# Patient Record
Sex: Female | Born: 1955 | Race: White | Hispanic: No | Marital: Single | Smoking: Former smoker
Health system: Southern US, Community
[De-identification: ages and names within clinical notes are randomized; demographics above are authoritative.]

## PROBLEM LIST (undated history)

## (undated) DIAGNOSIS — I493 Ventricular premature depolarization: Secondary | ICD-10-CM

## (undated) DIAGNOSIS — J189 Pneumonia, unspecified organism: Secondary | ICD-10-CM

## (undated) DIAGNOSIS — M5136 Other intervertebral disc degeneration, lumbar region: Secondary | ICD-10-CM

## (undated) DIAGNOSIS — I491 Atrial premature depolarization: Secondary | ICD-10-CM

## (undated) DIAGNOSIS — M51369 Other intervertebral disc degeneration, lumbar region without mention of lumbar back pain or lower extremity pain: Secondary | ICD-10-CM

## (undated) DIAGNOSIS — M1990 Unspecified osteoarthritis, unspecified site: Secondary | ICD-10-CM

## (undated) DIAGNOSIS — I10 Essential (primary) hypertension: Secondary | ICD-10-CM

## (undated) DIAGNOSIS — F329 Major depressive disorder, single episode, unspecified: Secondary | ICD-10-CM

## (undated) DIAGNOSIS — M4800 Spinal stenosis, site unspecified: Secondary | ICD-10-CM

## (undated) HISTORY — PX: ABDOMINAL HYSTERECTOMY: SHX81

## (undated) HISTORY — PX: OTHER SURGICAL HISTORY: SHX169

## (undated) HISTORY — PX: ROTATOR CUFF REPAIR: SHX139

## (undated) HISTORY — PX: CHOLECYSTECTOMY: SHX55

## (undated) HISTORY — PX: TONSILLECTOMY: SUR1361

## (undated) HISTORY — PX: BACK SURGERY: SHX140

## (undated) HISTORY — PX: MENISCUS REPAIR: SHX5179

## (undated) HISTORY — PX: FOOT SURGERY: SHX648

---

## 2010-04-13 DIAGNOSIS — J189 Pneumonia, unspecified organism: Secondary | ICD-10-CM

## 2010-04-13 HISTORY — DX: Pneumonia, unspecified organism: J18.9

## 2015-04-14 DIAGNOSIS — F32A Depression, unspecified: Secondary | ICD-10-CM

## 2015-04-14 HISTORY — DX: Depression, unspecified: F32.A

## 2015-08-01 ENCOUNTER — Emergency Department
Admission: EM | Admit: 2015-08-01 | Discharge: 2015-08-01 | Disposition: A | Discharge status: Home / Self Care | Payer: BLUE CROSS/BLUE SHIELD | Attending: Emergency Medicine | Admitting: Emergency Medicine

## 2015-08-01 ENCOUNTER — Emergency Department: Payer: BLUE CROSS/BLUE SHIELD

## 2015-08-01 ENCOUNTER — Encounter: Payer: Self-pay | Admitting: Emergency Medicine

## 2015-08-01 DIAGNOSIS — R6884 Jaw pain: Secondary | ICD-10-CM | POA: Diagnosis present

## 2015-08-01 DIAGNOSIS — R002 Palpitations: Secondary | ICD-10-CM

## 2015-08-01 DIAGNOSIS — Z87891 Personal history of nicotine dependence: Secondary | ICD-10-CM | POA: Insufficient documentation

## 2015-08-01 LAB — CBC WITH DIFFERENTIAL/PLATELET
BASOS ABS: 0.1 10*3/uL (ref 0–0.1)
BASOS PCT: 1 %
EOS ABS: 0.1 10*3/uL (ref 0–0.7)
Eosinophils Relative: 1 %
HCT: 47.5 % — ABNORMAL HIGH (ref 35.0–47.0)
Hemoglobin: 15.8 g/dL (ref 12.0–16.0)
Lymphocytes Relative: 17 %
Lymphs Abs: 2.9 10*3/uL (ref 1.0–3.6)
MCH: 30.2 pg (ref 26.0–34.0)
MCHC: 33.2 g/dL (ref 32.0–36.0)
MCV: 90.8 fL (ref 80.0–100.0)
MONO ABS: 1.2 10*3/uL — AB (ref 0.2–0.9)
MONOS PCT: 7 %
NEUTROS ABS: 12.4 10*3/uL — AB (ref 1.4–6.5)
NEUTROS PCT: 74 %
PLATELETS: 368 10*3/uL (ref 150–440)
RBC: 5.23 MIL/uL — ABNORMAL HIGH (ref 3.80–5.20)
RDW: 14 % (ref 11.5–14.5)
WBC: 16.8 10*3/uL — ABNORMAL HIGH (ref 3.6–11.0)

## 2015-08-01 LAB — BASIC METABOLIC PANEL
ANION GAP: 12 (ref 5–15)
BUN: 27 mg/dL — ABNORMAL HIGH (ref 6–20)
CALCIUM: 10.1 mg/dL (ref 8.9–10.3)
CO2: 27 mmol/L (ref 22–32)
CREATININE: 0.76 mg/dL (ref 0.44–1.00)
Chloride: 98 mmol/L — ABNORMAL LOW (ref 101–111)
Glucose, Bld: 113 mg/dL — ABNORMAL HIGH (ref 65–99)
Potassium: 3.4 mmol/L — ABNORMAL LOW (ref 3.5–5.1)
SODIUM: 137 mmol/L (ref 135–145)

## 2015-08-01 LAB — TROPONIN I

## 2015-08-01 LAB — MAGNESIUM: MAGNESIUM: 2.3 mg/dL (ref 1.7–2.4)

## 2015-08-01 NOTE — Discharge Instructions (Signed)

## 2015-08-01 NOTE — ED Notes (Signed)
Patient presents to the ED with left sided jaw pain since noon with palpitations.  Patient appears anxious.  Patient states, "I looked up jaw pain and I did not like what I saw."  Patient is alert and oriented x 4.  Patient denies any nausea, diaphoresis or shortness of breath.  Patient denies any chest discomfort or arm pain.

## 2015-08-01 NOTE — ED Provider Notes (Signed)
[facility] Emergency Department Provider Note  ____________________________________________  Time seen: Approximately 7 PM  I have reviewed the triage vital signs and the nursing notes.   HISTORY  Chief Complaint Jaw Pain and Palpitations   HPI Carry Weesner is a 60 y.o. female without any chronic medical conditions was presenting to the emergency department today with heart palpitations as well as sharp left jaw pain since noon today. She says that she has been under considerable stress for the last 3 days. She denies any chest pain or shortness of breath. Says that the palpitations have subsided as well as the left-sided sharp pain. She denies any history of heart attack in her cell but says that her father had a massive heart attack at 55 years old. Says she is a former smoker but has not smoked for about 2 years at this point. She does not take any medications at home. She says that she drinks 4 large cups of coffee per day but has been doing this her entire adult life. Denies any drug use or drinking.  History reviewed. No pertinent past medical history.  There are no active problems to display for this patient.   Past Surgical History  Procedure Laterality Date  . Meniscus repair    . Foot surgery    . Abdominal hysterectomy    . Back surgery    . Toe amputation    . Rotator cuff repair    . Cholecystectomy    . Tonsillectomy    . Excision of redundant skin      No current outpatient prescriptions on file.  Allergies Review of patient's allergies indicates no known allergies.  No family history on file.  Social History Social History  Substance Use Topics  . Smoking status: Former Games developer  . Smokeless tobacco: None  . Alcohol Use: Yes     Comment: occasionally    Review of Systems Constitutional: No fever/chills Eyes: No visual changes. ENT: No sore throat. Cardiovascular: Denies chest pain. Respiratory: Denies shortness of  breath. Gastrointestinal: No abdominal pain. no vomiting.  No diarrhea.  No constipation. Genitourinary: Negative for dysuria. Musculoskeletal: Negative for back pain. Skin: Negative for rash. Neurological: Negative for headaches, focal weakness or numbness.  10-point ROS otherwise negative.  ____________________________________________   PHYSICAL EXAM:  VITAL SIGNS: ED Triage Vitals  Enc Vitals Group     BP 08/01/15 1729 160/95 mmHg     Pulse Rate 08/01/15 1729 79     Resp 08/01/15 1729 17     Temp 08/01/15 1729 98.3 F (36.8 C)     Temp Source 08/01/15 1729 Oral     SpO2 08/01/15 1729 95 %     Weight 08/01/15 1729 170 lb (77.111 kg)     Height 08/01/15 1729  (1.626 m)     Head Cir --      Peak Flow --      Pain Score 08/01/15 1730 2     Pain Loc --      Pain Edu? --      Excl. in GC? --     Constitutional: Alert and oriented. Well appearing and in no acute distress. Eyes: Conjunctivae are normal. PERRL. EOMI. Head: Atraumatic. Nose: No congestion/rhinnorhea. Mouth/Throat: Mucous membranes are moist.  Neck: No stridor.   Cardiovascular: Normal rate, regular rhythm. Grossly normal heart sounds.   Respiratory: Normal respiratory effort.  No retractions. Lungs CTAB. Gastrointestinal: Soft and nontender. No distention. Musculoskeletal: No lower extremity tenderness nor  edema.  No joint effusions. Neurologic:  Normal speech and language. No gross focal neurologic deficits are appreciated.  Skin:  Skin is warm, dry and intact. No rash noted. Psychiatric: Mood and affect are normal. Speech and behavior are normal.  ____________________________________________   LABS (all labs ordered are listed, but only abnormal results are displayed)  Labs Reviewed  CBC WITH DIFFERENTIAL/PLATELET - Abnormal; Notable for the following:    WBC 16.8 (*)    RBC 5.23 (*)    HCT 47.5 (*)    Neutro Abs 12.4 (*)    Monocytes Absolute 1.2 (*)    All other components within normal  limits  BASIC METABOLIC PANEL  TROPONIN I  MAGNESIUM   ____________________________________________  EKG  ED ECG REPORT I, Arelia LongestSchaevitz,  David M, the attending physician, personally viewed and interpreted this ECG.   Date: 08/01/2015  EKG Time: 1724  Rate: 89  Rhythm: normal sinus rhythm with several PVCs  Axis: Left axis deviation  Intervals:none  ST&T Change: No ST segment elevation or depression. T-wave inversion in aVL. No previous for comparison.  ____________________________________________  RADIOLOGY   IMPRESSION: 1. No active cardiopulmonary disease is radiographically apparent. 2. Atherosclerotic aortic arch.   Electronically Signed By: Gaylyn RongWalter Liebkemann M.D. On: 08/01/2015 19:37          ____________________________________________   PROCEDURES   ____________________________________________   INITIAL IMPRESSION / ASSESSMENT AND PLAN / ED COURSE  Pertinent labs & imaging results that were available during my care of the patient were reviewed by me and considered in my medical decision making (see chart for details).  ----------------------------------------- 8:08 PM on 08/01/2015 -----------------------------------------  Patient resting comfortably at this time. Seeing that she is still having occasional and intermittent palpitations and also very mild pain to her left jaw. I palpated the area of the left jaw she was concerned about it appears to be a notch in the mandible with mild tenderness to palpation. No swelling or lymphadenopathy over that area.  Patient also with an elevated white blood cell count of uncertain significance. She does not appear to have an infectious focus. I discussed the lab findings as well as x-ray findings with the patient. Her electrolytes are reassuring considering her chief complaint and her troponin was undetectable. After 6 hours I would expect to see some sort of bump in her cardiac enzyme. I discussed  lifestyle modifications with the patient such as decreasing her caffeine intake. She says that she drinks an entire pot of Any cough in the afternoon and says that she'll be able to limited data from her routine. There is possibly also a stress component to her presentation. However, it is also concerning that she had atherosclerosis on her chest x-ray. I discussed with the patient and the need for follow-up with cardiology. She will try to lifestyle modifications first and will return to the emergency department for any worsening or concerning symptoms. Did offer to check another troponin especially because of the jaw pain, however the patient prefers to go home and come back if her symptoms worsen or change and plan for follow-up and will call for cardiology in the morning for appointment this coming Monday. She'll be discharged at this time. ____________________________________________   FINAL CLINICAL IMPRESSION(S) / ED DIAGNOSES  Palpitations.    Myrna Blazeravid Matthew Schaevitz, MD 08/01/15 2018

## 2015-09-16 ENCOUNTER — Other Ambulatory Visit: Payer: Self-pay | Admitting: Family Medicine

## 2015-09-16 DIAGNOSIS — Z1231 Encounter for screening mammogram for malignant neoplasm of breast: Secondary | ICD-10-CM

## 2015-09-26 ENCOUNTER — Other Ambulatory Visit: Payer: Self-pay | Admitting: Orthopedic Surgery

## 2015-09-26 DIAGNOSIS — M51369 Other intervertebral disc degeneration, lumbar region without mention of lumbar back pain or lower extremity pain: Secondary | ICD-10-CM

## 2015-09-26 DIAGNOSIS — M5136 Other intervertebral disc degeneration, lumbar region: Secondary | ICD-10-CM

## 2015-10-18 ENCOUNTER — Ambulatory Visit
Admission: RE | Admit: 2015-10-18 | Discharge: 2015-10-18 | Disposition: A | Discharge status: Home / Self Care | Payer: BLUE CROSS/BLUE SHIELD | Source: Ambulatory Visit | Attending: Orthopedic Surgery | Admitting: Orthopedic Surgery

## 2015-10-18 DIAGNOSIS — M4806 Spinal stenosis, lumbar region: Secondary | ICD-10-CM | POA: Insufficient documentation

## 2015-10-18 DIAGNOSIS — M5126 Other intervertebral disc displacement, lumbar region: Secondary | ICD-10-CM | POA: Insufficient documentation

## 2015-10-18 DIAGNOSIS — M479 Spondylosis, unspecified: Secondary | ICD-10-CM | POA: Diagnosis not present

## 2015-10-18 DIAGNOSIS — M5136 Other intervertebral disc degeneration, lumbar region: Secondary | ICD-10-CM | POA: Insufficient documentation

## 2016-01-23 ENCOUNTER — Encounter: Payer: Self-pay | Admitting: *Deleted

## 2016-01-29 ENCOUNTER — Ambulatory Visit: Payer: BLUE CROSS/BLUE SHIELD | Admitting: Anesthesiology

## 2016-01-29 ENCOUNTER — Ambulatory Visit
Admission: RE | Admit: 2016-01-29 | Discharge: 2016-01-29 | Disposition: A | Discharge status: Home / Self Care | Payer: BLUE CROSS/BLUE SHIELD | Source: Ambulatory Visit | Attending: Podiatry | Admitting: Podiatry

## 2016-01-29 ENCOUNTER — Encounter
Admission: RE | Disposition: A | Discharge status: Home / Self Care | Payer: Self-pay | Source: Ambulatory Visit | Attending: Podiatry

## 2016-01-29 DIAGNOSIS — M2021 Hallux rigidus, right foot: Secondary | ICD-10-CM | POA: Insufficient documentation

## 2016-01-29 DIAGNOSIS — Z87891 Personal history of nicotine dependence: Secondary | ICD-10-CM | POA: Diagnosis not present

## 2016-01-29 HISTORY — DX: Other intervertebral disc degeneration, lumbar region: M51.36

## 2016-01-29 HISTORY — DX: Other intervertebral disc degeneration, lumbar region without mention of lumbar back pain or lower extremity pain: M51.369

## 2016-01-29 HISTORY — PX: HARDWARE REMOVAL: SHX979

## 2016-01-29 HISTORY — PX: ARTHRODESIS METATARSALPHALANGEAL JOINT (MTPJ): SHX6566

## 2016-01-29 HISTORY — DX: Unspecified osteoarthritis, unspecified site: M19.90

## 2016-01-29 SURGERY — FUSION, JOINT, GREAT TOE
Anesthesia: Regional | Site: First Toe | Laterality: Right | Wound class: Clean

## 2016-01-29 MED ORDER — FENTANYL CITRATE (PF) 100 MCG/2ML IJ SOLN
INTRAMUSCULAR | Status: DC | PRN
  Administered 2016-01-29: 50 ug via INTRAVENOUS

## 2016-01-29 MED ORDER — ONDANSETRON HCL 4 MG/2ML IJ SOLN: 4.0000 mg | Freq: Once | INTRAMUSCULAR | Status: DC | PRN

## 2016-01-29 MED ORDER — MIDAZOLAM HCL 5 MG/5ML IJ SOLN
INTRAMUSCULAR | Status: DC | PRN
  Administered 2016-01-29 (×2): 2 mg via INTRAVENOUS

## 2016-01-29 MED ORDER — ACETAMINOPHEN 160 MG/5ML PO SOLN: 325.0000 mg | ORAL | Status: DC | PRN

## 2016-01-29 MED ORDER — DEXAMETHASONE SODIUM PHOSPHATE 4 MG/ML IJ SOLN
INTRAMUSCULAR | Status: DC | PRN
  Administered 2016-01-29: 4 mg via PERINEURAL
  Administered 2016-01-29: 4 mg via INTRAVENOUS

## 2016-01-29 MED ORDER — OXYCODONE HCL 5 MG PO TABS: 5.0000 mg | ORAL_TABLET | Freq: Once | ORAL | Status: DC | PRN

## 2016-01-29 MED ORDER — OXYCODONE-ACETAMINOPHEN 5-325 MG PO TABS: 1.0000 | ORAL_TABLET | ORAL | 0 refills | Status: DC | PRN

## 2016-01-29 MED ORDER — DEXTROSE 5 % IV SOLN
2000.0000 mg | Freq: Once | INTRAVENOUS | Status: AC
  Administered 2016-01-29: 2000 mg via INTRAVENOUS

## 2016-01-29 MED ORDER — ONDANSETRON HCL 4 MG/2ML IJ SOLN
INTRAMUSCULAR | Status: DC | PRN
  Administered 2016-01-29: 4 mg via INTRAVENOUS

## 2016-01-29 MED ORDER — BUPIVACAINE HCL (PF) 0.5 % IJ SOLN
INTRAMUSCULAR | Status: DC | PRN
  Administered 2016-01-29: 35 mL via PERINEURAL

## 2016-01-29 MED ORDER — HYDROMORPHONE HCL 1 MG/ML IJ SOLN: 0.2500 mg | INTRAMUSCULAR | Status: DC | PRN

## 2016-01-29 MED ORDER — BUPIVACAINE HCL (PF) 0.25 % IJ SOLN
INTRAMUSCULAR | Status: DC | PRN
  Administered 2016-01-29: 10 mL

## 2016-01-29 MED ORDER — LACTATED RINGERS IV SOLN
INTRAVENOUS | Status: DC
  Administered 2016-01-29: 11:00:00 via INTRAVENOUS

## 2016-01-29 MED ORDER — PROPOFOL 10 MG/ML IV BOLUS
INTRAVENOUS | Status: DC | PRN
Start: 2016-01-29 — End: 2016-01-29
  Administered 2016-01-29: 150 mg via INTRAVENOUS

## 2016-01-29 MED ORDER — ACETAMINOPHEN 325 MG PO TABS: 325.0000 mg | ORAL_TABLET | ORAL | Status: DC | PRN

## 2016-01-29 MED ORDER — OXYCODONE HCL 5 MG/5ML PO SOLN: 5.0000 mg | Freq: Once | ORAL | Status: DC | PRN

## 2016-01-29 SURGICAL SUPPLY — 51 items
BANDAGE ELASTIC 4 VELCRO NS (GAUZE/BANDAGES/DRESSINGS) ×2 IMPLANT
BENZOIN TINCTURE PRP APPL 2/3 (GAUZE/BANDAGES/DRESSINGS) ×2 IMPLANT
BIT DRILL 2.4X140 LONG SOLID (BIT) ×2 IMPLANT
BIT DRILL SOLID 2.0 X 110MM (DRILL) ×1 IMPLANT
BLADE CRESCENTIC (BLADE) ×2 IMPLANT
BLADE SURG 15 STRL LF DISP TIS (BLADE) IMPLANT
BLADE SURG 15 STRL SS (BLADE)
BNDG ESMARK 4X12 TAN STRL LF (GAUZE/BANDAGES/DRESSINGS) ×2 IMPLANT
BNDG GAUZE 4.5X4.1 6PLY STRL (MISCELLANEOUS) ×2 IMPLANT
BNDG STRETCH 4X75 STRL LF (GAUZE/BANDAGES/DRESSINGS) ×2 IMPLANT
CANISTER SUCT 1200ML W/VALVE (MISCELLANEOUS) ×2 IMPLANT
COVER LIGHT HANDLE UNIVERSAL (MISCELLANEOUS) ×4 IMPLANT
CUFF TOURN SGL QUICK 18 (TOURNIQUET CUFF) ×2 IMPLANT
DRAPE FLUOR MINI C-ARM 54X84 (DRAPES) ×2 IMPLANT
DRILL SOLID 2.0 X 110MM (DRILL) ×2
DURAPREP 26ML APPLICATOR (WOUND CARE) ×2 IMPLANT
GAUZE PETRO XEROFOAM 1X8 (MISCELLANEOUS) ×2 IMPLANT
GAUZE SPONGE 4X4 12PLY STRL (GAUZE/BANDAGES/DRESSINGS) ×2 IMPLANT
GLOVE BIO SURGEON STRL SZ7.5 (GLOVE) ×4 IMPLANT
GLOVE INDICATOR 8.0 STRL GRN (GLOVE) ×4 IMPLANT
GOWN STRL REUS W/ TWL LRG LVL3 (GOWN DISPOSABLE) ×2 IMPLANT
GOWN STRL REUS W/TWL LRG LVL3 (GOWN DISPOSABLE) ×2
K-WIRE DBL END TROCAR 6X.045 (WIRE)
K-WIRE DBL END TROCAR 6X.062 (WIRE)
KIT ROOM TURNOVER OR (KITS) ×2 IMPLANT
KWIRE DBL END TROCAR 6X.045 (WIRE) IMPLANT
KWIRE DBL END TROCAR 6X.062 (WIRE) IMPLANT
NS IRRIG 500ML POUR BTL (IV SOLUTION) ×2 IMPLANT
PACK EXTREMITY ARMC (MISCELLANEOUS) ×2 IMPLANT
PAD GROUND ADULT SPLIT (MISCELLANEOUS) ×2 IMPLANT
PARAGON 28 2.4MM DRILL BIT ×2 IMPLANT
PARAGON 28 OLIVE WIRE ×6 IMPLANT
PIN BALLS 3/8 F/.045 WIRE (MISCELLANEOUS) IMPLANT
PLATE MTP 0DEG RIGHT (Plate) ×2 IMPLANT
PUTTY DBM OPTIUM 1CC (Bone Implant) ×2 IMPLANT
RASP SM TEAR CROSS CUT (RASP) ×2 IMPLANT
SCREW LOCK PLATE R3 2.7X10 (Screw) ×4 IMPLANT
SCREW LOCK PLATE R3 2.7X16 ×2 IMPLANT
SCREW LOCK PLATE R3 2.7X18 (Screw) ×2 IMPLANT
SCREW NON LOCKING 3.5X14 (Screw) ×2 IMPLANT
SCREW NON LOCKING 3.5X20 (Screw) IMPLANT
STOCKINETTE STRL 6IN 960660 (GAUZE/BANDAGES/DRESSINGS) ×2 IMPLANT
STRAP BODY AND KNEE 60X3 (MISCELLANEOUS) ×2 IMPLANT
STRIP CLOSURE SKIN 1/4X4 (GAUZE/BANDAGES/DRESSINGS) ×2 IMPLANT
SUT MNCRL 5-0+ PC-1 (SUTURE) ×1 IMPLANT
SUT MONOCRYL 5-0 (SUTURE) ×1
SUT VIC AB 2-0 SH 27 (SUTURE)
SUT VIC AB 2-0 SH 27XBRD (SUTURE) IMPLANT
SUT VIC AB 3-0 SH 27 (SUTURE) ×1
SUT VIC AB 3-0 SH 27X BRD (SUTURE) ×1 IMPLANT
SUT VIC AB 4-0 FS2 27 (SUTURE) ×2 IMPLANT

## 2016-01-29 NOTE — Anesthesia Preprocedure Evaluation (Addendum)
Anesthesia Evaluation  Patient identified by MRN, date of birth, ID band Patient awake    Reviewed: Allergy & Precautions, H&P , NPO status , Patient's Chart, lab work & pertinent test results, reviewed documented beta blocker date and time   Airway Mallampati: I  TM Distance: >3 FB Neck ROM: full    Dental no notable dental hx.    Pulmonary former smoker,    Pulmonary exam normal breath sounds clear to auscultation       Cardiovascular Exercise Tolerance: Good negative cardio ROS   Rhythm:regular Rate:Normal     Neuro/Psych negative neurological ROS  negative psych ROS   GI/Hepatic negative GI ROS, Neg liver ROS,   Endo/Other  negative endocrine ROS  Renal/GU negative Renal ROS  negative genitourinary   Musculoskeletal   Abdominal   Peds  Hematology negative hematology ROS (+)   Anesthesia Other Findings   Reproductive/Obstetrics negative OB ROS                            Anesthesia Physical Anesthesia Plan  ASA: II  Anesthesia Plan: General and Regional   Post-op Pain Management: GA combined w/ Regional for post-op pain   Induction:   Airway Management Planned:   Additional Equipment:   Intra-op Plan:   Post-operative Plan:   Informed Consent: I have reviewed the patients History and Physical, chart, labs and discussed the procedure including the risks, benefits and alternatives for the proposed anesthesia with the patient or authorized representative who has indicated his/her understanding and acceptance.   Dental Advisory Given  Plan Discussed with: CRNA and Anesthesiologist  Anesthesia Plan Comments:         Anesthesia Quick Evaluation

## 2016-01-29 NOTE — Anesthesia Procedure Notes (Signed)
Procedure Name: LMA Insertion Date/Time: 01/29/2016 12:05 PM Performed by: Maree KrabbeWARREN, Belinda Uballe Pre-anesthesia Checklist: Patient identified, Emergency Drugs available, Suction available, Timeout performed and Patient being monitored Patient Re-evaluated:Patient Re-evaluated prior to inductionOxygen Delivery Method: Circle system utilized Preoxygenation: Pre-oxygenation with 100% oxygen Intubation Type: IV induction LMA: LMA inserted LMA Size: 4.0 Number of attempts: 1 Placement Confirmation: positive ETCO2 and breath sounds checked- equal and bilateral Tube secured with: Tape

## 2016-01-29 NOTE — Transfer of Care (Signed)
Immediate Anesthesia Transfer of Care Note  Patient: Belinda Mitchell  Procedure(s) Performed: Procedure(s) with comments: ARTHRODESIS 1ST METATARSALPHALANGEAL JOINT (MTPJ) (Right) HARDWARE REMOVAL (Right) - PLATE AND 5 SCREWS REMOVED  Patient Location: PACU  Anesthesia Type: General, Regional  Level of Consciousness: awake, alert  and patient cooperative  Airway and Oxygen Therapy: Patient Spontanous Breathing and Patient connected to supplemental oxygen  Post-op Assessment: Post-op Vital signs reviewed, Patient's Cardiovascular Status Stable, Respiratory Function Stable, Patent Airway and No signs of Nausea or vomiting  Post-op Vital Signs: Reviewed and stable  Complications: No apparent anesthesia complications

## 2016-01-29 NOTE — Op Note (Signed)
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    EBL: Minimal    Anesthesia:regional and general    Hemostasis: Midcalf tourniquet inflated to 200 mmHg for 100 minutes    Specimen: None    Complications: None    Operative indications:Belinda Mitchell is an 60 y.o. that presents today for surgical intervention.  The risks/benefits/alternatives/complications have been discussed and consent has been given.    Procedure:  Patient was brought into the OR and placed on the operating table in thesupine position. After anesthesia was obtained theright lower extremity was prepped and draped in usual sterile fashion.  Attention was directed to the dorsomedial right first MTPJ where a longitudinal incision was performed. Sharp and blunt dissection carried down to the capsule to the metatarsal. The dorsomedial lateral aspect of the MTPJ region was sharply of all tissue. There was a dorsal locking plate and this was removed from the surgical field in toto. This time the MTPJ fusion site was evaluated. Revision fusion site was evaluated under fluoroscopy. Next medial to lateral a crescentic saw blade was used to make a medial to lateral osteotomy. The toe was brought slightly plantar and of a mild varus position into a more anatomic position. The fusion site was then fenestrated with a 20 punch drilled. Was taken down to good subchondral bone plate. At this time a dorsal locking plate from the [facility] implant set was used. A distal 2.7 mm locking screw was placed initially. A compression screw 3.5 mm was placed in the most proximal aspect. A second distal locking screw was placed 2.7 mm and a 2 x 2.7 millimeter locking screws were placed proximal to the fusion site. Alignment was noted in all planes. Clear  there was good alignment of the first MTPJ and great toe. At this time the fusion site was then packed to the surrounding bone with bone putty. Closure was then performed with 3-0 Vicryl for Vicryl and 5-0 Monocryl undyed. 0.25% Marcaine was placed around all areas. She is placed in a well compressive sterile dressing and a equalizer walker boot.    Patient tolerated the procedure and anesthesia well.  Was transported from the OR to the PACU with all vital signs stable and vascular status intact. To be discharged per routine protocol.  Will follow up in approximately 1 week in the outpatient clinic.

## 2016-01-29 NOTE — Discharge Instructions (Signed)
[facility] °[facility] ° °POST OPERATIVE INSTRUCTIONS FOR DR. TROXLER AND DR. Marzetta Lanza °[facility] ° ° °1. Take your medication as prescribed.  Pain medication should be taken only as needed. ° °2. Keep the dressing clean, dry and intact. ° °3. Keep your foot elevated above the heart level for the first 48 hours. ° °4. Walking to the bathroom and brief periods of walking are acceptable, unless we have instructed you to be non-weight bearing. ° °5. Always wear your post-op shoe when walking.  Always use your crutches if you are to be non-weight bearing. ° °6. Do not take a shower.   ° °7. Every hour you are awake:  °- Bend your knee 15 times. °- Massage calf 15 times ° °8. Call [facility] ([phone]) if any of the following problems occur: °- You develop a temperature or fever. °- The bandage becomes saturated with blood. °- Medication does not stop your pain. °- Injury of the foot occurs. °- Any symptoms of infection including redness, odor, or red streaks running from wound. ° °General Anesthesia, Adult, Care After °Refer to this sheet in the next few weeks. These instructions provide you with information on caring for yourself after your procedure. Your health care provider may also give you more specific instructions. Your treatment has been planned according to current medical practices, but problems sometimes occur. Call your health care provider if you have any problems or questions after your procedure. °WHAT TO EXPECT AFTER THE PROCEDURE °After the procedure, it is typical to experience: °· Sleepiness. °· Nausea and vomiting. °HOME CARE INSTRUCTIONS °· For the first 24 hours after general anesthesia: °¨ Have a responsible person with you. °¨ Do not drive a car. If you are alone, do not take public transportation. °¨ Do not drink alcohol. °¨ Do not take medicine that has not been prescribed by your health care provider. °¨ Do not sign important  papers or make important decisions. °¨ You may resume a normal diet and activities as directed by your health care provider. °· Change bandages (dressings) as directed. °· If you have questions or problems that seem related to general anesthesia, call the hospital and ask for the anesthetist or anesthesiologist on call. °SEEK MEDICAL CARE IF: °· You have nausea and vomiting that continue the day after anesthesia. °· You develop a rash. °SEEK IMMEDIATE MEDICAL CARE IF:  °· You have difficulty breathing. °· You have chest pain. °· You have any allergic problems. °  °This information is not intended to replace advice given to you by your health care provider. Make sure you discuss any questions you have with your health care provider. °  °Document Released: 07/06/2000 Document Revised: 04/20/2014 Document Reviewed: 07/29/2011 °Elsevier Interactive Patient Education ©2016 [employer] ° °

## 2016-01-29 NOTE — Anesthesia Postprocedure Evaluation (Signed)
Anesthesia Post Note  Patient: Belinda HarmanCynthia Mitchell  Procedure(s) Performed: Procedure(s) (LRB): ARTHRODESIS 1ST METATARSALPHALANGEAL JOINT (MTPJ) (Right) HARDWARE REMOVAL (Right)  Patient location during evaluation: PACU Anesthesia Type: General Level of consciousness: awake and alert Pain management: pain level controlled Vital Signs Assessment: post-procedure vital signs reviewed and stable Respiratory status: spontaneous breathing, nonlabored ventilation, respiratory function stable and patient connected to nasal cannula oxygen Cardiovascular status: blood pressure returned to baseline and stable Postop Assessment: no signs of nausea or vomiting Anesthetic complications: no    Alta CorningBacon, Alaric Gladwin S

## 2016-01-29 NOTE — H&P (Signed)
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  The various methods of treatment have been discussed with the patient.  No guarantees were given.  After consideration of risks, benefits and other options for treatment, the patient has consented to surgery.  I have reviewed the patients' chart and labs.    Patient Vitals for the past 24 hrs:  BP Temp Temp src Pulse Resp SpO2 Weight  01/29/16 1120 128/84 - - 75 (!) 24 100 % -  01/29/16 1115 136/77 - - 73 11 100 % -  01/29/16 1110 (!) 153/96 - - 63 12 98 % -  01/29/16 1053 (!) 147/94 97.8 F (36.6 C) Temporal 71 - 100 % 79.4 kg (175 lb)    A history and physical examination was performed in my office.  The patient was reexamined.  There have been no changes to this history and physical examination.  Gwyneth RevelsFowler, Sigfredo Schreier A

## 2016-01-29 NOTE — Anesthesia Procedure Notes (Signed)
Anesthesia Regional Block:  Popliteal block  Pre-Anesthetic Checklist: ,, timeout performed, Correct Patient, Correct Site, Correct Laterality, Correct Procedure, Correct Position, site marked, Risks and benefits discussed,  Surgical consent,  Pre-op evaluation,  At surgeon's request and post-op pain management   Prep: chloraprep       Needles:  Injection technique: Single-shot  Needle Type: Echogenic Needle     Needle Length: 9cm 9 cm Needle Gauge: 21 and 21 G    Additional Needles:  Procedures: ultrasound guided (picture in chart) Popliteal block Narrative:  Start time: 01/29/2016 11:10 AM End time: 01/29/2016 11:20 AM Injection made incrementally with aspirations every 5 mL.  Performed by: Personally  Anesthesiologist: Dorrien Grunder  Additional Notes: Functioning IV was confirmed and monitors applied. Ultrasound guidance: relevant anatomy identified, needle position confirmed, local anesthetic spread visualized around nerve(s)., vascular puncture avoided.  Image printed for medical record.  Negative aspiration and no paresthesias; incremental administration of local anesthetic. The patient tolerated the procedure well. Vitals signes recorded in RN notes.

## 2016-01-31 ENCOUNTER — Encounter: Payer: Self-pay | Admitting: Podiatry

## 2016-02-28 ENCOUNTER — Other Ambulatory Visit: Payer: Self-pay | Admitting: Orthopedic Surgery

## 2016-02-28 DIAGNOSIS — M170 Bilateral primary osteoarthritis of knee: Secondary | ICD-10-CM

## 2016-03-09 ENCOUNTER — Ambulatory Visit
Admission: RE | Admit: 2016-03-09 | Discharge: 2016-03-09 | Disposition: A | Discharge status: Home / Self Care | Payer: BLUE CROSS/BLUE SHIELD | Source: Ambulatory Visit | Attending: Orthopedic Surgery | Admitting: Orthopedic Surgery

## 2016-03-09 DIAGNOSIS — M170 Bilateral primary osteoarthritis of knee: Secondary | ICD-10-CM | POA: Diagnosis present

## 2016-03-25 ENCOUNTER — Encounter
Admission: RE | Admit: 2016-03-25 | Discharge: 2016-03-25 | Disposition: A | Discharge status: Home / Self Care | Payer: BLUE CROSS/BLUE SHIELD | Source: Ambulatory Visit | Attending: Orthopedic Surgery | Admitting: Orthopedic Surgery

## 2016-03-25 DIAGNOSIS — Z01812 Encounter for preprocedural laboratory examination: Secondary | ICD-10-CM | POA: Insufficient documentation

## 2016-03-25 DIAGNOSIS — M170 Bilateral primary osteoarthritis of knee: Secondary | ICD-10-CM | POA: Insufficient documentation

## 2016-03-25 HISTORY — DX: Pneumonia, unspecified organism: J18.9

## 2016-03-25 HISTORY — DX: Major depressive disorder, single episode, unspecified: F32.9

## 2016-03-25 LAB — CBC
HCT: 45.1 % (ref 35.0–47.0)
Hemoglobin: 15.1 g/dL (ref 12.0–16.0)
MCH: 30.4 pg (ref 26.0–34.0)
MCHC: 33.4 g/dL (ref 32.0–36.0)
MCV: 91 fL (ref 80.0–100.0)
PLATELETS: 417 10*3/uL (ref 150–440)
RBC: 4.96 MIL/uL (ref 3.80–5.20)
RDW: 14.2 % (ref 11.5–14.5)
WBC: 7.5 10*3/uL (ref 3.6–11.0)

## 2016-03-25 LAB — BASIC METABOLIC PANEL
Anion gap: 8 (ref 5–15)
BUN: 26 mg/dL — AB (ref 6–20)
CALCIUM: 9.4 mg/dL (ref 8.9–10.3)
CO2: 27 mmol/L (ref 22–32)
CREATININE: 0.65 mg/dL (ref 0.44–1.00)
Chloride: 101 mmol/L (ref 101–111)
GFR calc Af Amer: >60 mL/min (ref >60)
Glucose, Bld: 71 mg/dL (ref 65–99)
POTASSIUM: 3.4 mmol/L — AB (ref 3.5–5.1)
SODIUM: 136 mmol/L (ref 135–145)

## 2016-03-25 LAB — URINALYSIS, ROUTINE W REFLEX MICROSCOPIC
BILIRUBIN URINE: NEGATIVE
Bacteria, UA: NONE SEEN
GLUCOSE, UA: NEGATIVE mg/dL
Ketones, ur: NEGATIVE mg/dL
NITRITE: NEGATIVE
PROTEIN: NEGATIVE mg/dL
Specific Gravity, Urine: 1.01 (ref 1.005–1.030)
pH: 5 (ref 5.0–8.0)

## 2016-03-25 LAB — PROTIME-INR
INR: 0.83
PROTHROMBIN TIME: 11.4 s (ref 11.4–15.2)

## 2016-03-25 LAB — SEDIMENTATION RATE: SED RATE: 1 mm/h (ref 0–30)

## 2016-03-25 LAB — TYPE AND SCREEN
ABO/RH(D): B NEG
Antibody Screen: NEGATIVE

## 2016-03-25 LAB — SURGICAL PCR SCREEN
MRSA, PCR: NEGATIVE
Staphylococcus aureus: POSITIVE — AB

## 2016-03-25 LAB — APTT: aPTT: 29 seconds (ref 24–36)

## 2016-03-25 NOTE — Pre-Procedure Instructions (Signed)
Assessment and Plan   60 y.o. female with  ICD-10-CM ICD-9-CM  1. Atypical chest pain-etiology is chest pain is unclear. No evidence of ischemia on functional study. Will place on hydrochlorothiazide for blood pressure control as well as fluid control for now. R07.89 786.59   2. Heart palpitations-will follow. Evaluate for evidence of ischemic etiology or structural etiology. R00.2 785.1  3. SOB (shortness of breath)-etiology of shortness of breath is unclear. Echo showed preserved LV function with no high-grade valvular disease. R06.02 786.05  4. Family history of abdominal aortic aneurysm-patient has a history of abdominal aortic aneurysm in her family. Abdominal ultrasound revealed no aneurysm. Z82.49 V17.49   Return in about 6 months (around 02/29/2016).  These notes generated with voice recognition software. I apologize for typographical errors.  Denton ArKENNETH ALAN FATH, MD      NM myocardial perfusion SPECT multiple (stress and rest)08/08/2015 [facility] Result Impression  ETT with no evidence of ischemia with PVCs.No sustained ventricular  arrhythmias.Sestamibi images revealed normal LV function.There is no  reversible ischemia noted.Low risk study.  Result Narrative  [facility] [address], [place], [id number] [phone]  Procedure: Exercise Myocardial Perfusion Imaging ONE day procedure  Indication: Atypical chest pain Plan: NM myocardial perfusion SPECT multiple (stress  and rest), ECG stress test only  Ordering Physician:   Dr. Harold HedgeKenneth Fath   Clinical History: 60 y.o. year old female Vitals: Height: 65 inWeight: 177 lb Cardiac risk factors include:  Family Hx CAD    Procedure: The patient performed treadmill exercise using a Bruce protocol for 6:00  minutes. The exercise test was stopped due to SOB/fatigue.Blood pressure  response was hypertensive.    Rest HR: 76bpm Rest BP: 144/5996mmHg Max HR: 148bpm Max BP: 186/13400mmHg Mets: 7.00 % MAX HR: 92%  Stress Test Administered by: Percell BostonINA WALLACE, CMA  ECG Interpretation: Rest YNW:GNFAOZECG:normal sinus rhythm, none Stress HYQ:MVHQIECG:sinus tachycardia and premature ventricular contractions  (PVC), nonspecific ST-T wave changes Recovery ONG:EXBMWUECG:normal sinus rhythm ECG Interpretation:negative, nondiagnostic changes.   Administrations This Visit  technetium Tc6673m sestamibi (CARDIOLITE) injection 13.93 millicurie  Admin Date Action Dose Route Administered By      08/08/2015 Given 13.93 millicurie Intravenous Scott N Goard, CNMT      technetium Tc6373m sestamibi (CARDIOLITE) injection 33.47 millicurie  Admin Date Action Dose Route Administered By      08/08/2015 Given 33.47 millicurie Intravenous Titus DubinScott N Goard, CNMT   Echocardiogram 2D [facility]02[facility]2016 [facility] Component Name Value Ref Range  LV Ejection Fraction (%) 55   Aortic Valve Regurgitation Grade mild   Aortic Valve Stenosis Grade none   Aortic Valve Max Velocity (m/s) 1 m/sec   Aortic Valve Stenosis Mean Gradient (mmHg) 2.0 mmHg   Mitral Valve Regurgitation Grade mild   Mitral Valve Stenosis Grade none   Tricuspid Valve Regurgitation Grade trivial   Tricuspid Valve Regurgitation Max Velocity (m/s) 1.5 m/sec   Right Ventricle Systolic Pressure (mmHg) 13.9 mmHg   LV End Diastolic Diameter (cm) 4.3 cm  LV End Systolic Diameter (cm) 3.2 cm  LV Septum Wall Thickness (cm) 1.2 cm  LV Posterior Wall Thickness (cm) 1.1 cm  Left Atrium Diameter (cm) 3.6 cm  Result Narrative  CARDIOLOGY Genevive BiDEPARTMENTHOFFMAN, Dianca  [facility] [id number] [facility] Acct #: [id number] [address], [place], [place] [id number] Date:  08/22/2015 10:45 AM  Adult Female Age: 6 yrs ECHOCARDIOGRAM REPORTOutpatient  KC::KCWC  STUDY:CHEST WALL TAPE:0000:00: 0:00:00MD1:FATH, KENNETH ALAN ECHO:Yes  DOPPLER:YesFILE:0000-000-000BP: 118/64 mmHg  COLOR:YesCONTRAST:No MACHINE:PhilipsHeight: 65 in  RV BIOPSY:No 3D:NoSOUND QLTY:Moderate Weight: 177 lb MEDIUM:NoneBSA: 1.9 m2  ___________________________________________________________________________________________  HISTORY:SOB REASON:Assess, LV function INDICATION:R06.02 Shortness of breath  ___________________________________________________________________________________________ ECHOCARDIOGRAPHIC MEASUREMENTS 2D DIMENSIONS AORTA ValuesNormal RangeMAIN PAValuesNormal Range Annulus:nm* [2.1 - 2.5]PA Main:nm* [1.5 - 2.1] Aorta Sin:nm* [2.7 - 3.3] RIGHT VENTRICLE ST Junction:nm* [2.3 - 2.9]RV Base:3.0 cm[ < 4.2] Asc.Aorta:4.0 cm[2.3 - 3.1] RV Mid:nm* [ < 3.5]  LEFT VENTRICLERV Length:nm* [ < 8.6] LVIDd:4.3 cm[3.9 - 5.3] INFERIOR VENA CAVA LVIDs:3.2 cmMax. IVC:nm* [ <= 2.1]  FS:25.5 %[> 25]Min. IVC:nm* SWT:1.2 cm[0.5 - 0.9] ------------------ PWT:1.1 cm[0.5 - 0.9] nm* - not measured  LEFT  ATRIUM LA Diam:3.6 cm[2.7 - 3.8] LA A4C Area:nm* [ < 20] LA Volume:nm* [22 - 52]  ___________________________________________________________________________________________ ECHOCARDIOGRAPHIC DESCRIPTIONS  AORTIC ROOT Size:Normal Dissection:INDETERM FOR DISSECTION  AORTIC VALVE Leaflets:Tricuspid Morphology:Normal Mobility:Fully mobile  LEFT VENTRICLE Size:NormalAnterior:Normal  Contraction:Normal Lateral:Normal Closest EF:>55% (Estimated)Septal:Normal  LV Masses:No Masses Apical:Normal  XBJ:YNWGLVH:MILD LVHInferior:Normal Posterior:Normal Dias.FxClass:(Grade 1) relaxation abnormal, E/A reversal  MITRAL VALVE Leaflets:NormalMobility:Fully mobile Morphology:Normal  LEFT ATRIUM Size:Normal LA Masses:No masses  IA Septum:Normal IAS  MAIN PA Size:Normal  PULMONIC VALVE Morphology:NormalMobility:Fully mobile  RIGHT VENTRICLE  RV Masses:No Masses Size:Normal  Free Wall:Normal Contraction:Normal  TRICUSPID VALVE Leaflets:NormalMobility:Fully mobile Morphology:Normal  RIGHT ATRIUM Size:NormalRA Other:None  RA Mass:No masses  PERICARDIUM  Fluid:No effusion  INFERIOR VENACAVA Size:Normal Normal respiratory collapse   ____________________________________________________________________ DOPPLER ECHO and OTHER SPECIAL PROCEDURES  Aortic:MILD AR No AS 96.3 cm/sec peak vel3.7 mmHg peak grad 2.0 mmHg mean grad2.8 cm^2 by DOPPLER   Mitral:MILD MR No MS 2.8 cm^2 by DOPPLER MV Inflow  E Vel=35.5 cm/sec MV Annulus E'Vel=3.6 cm/sec E/E'Ratio=9.9  Tricuspid:TRIVIAL TRNo TS 149.0 cm/sec peak TR vel13.9 mmHg peak RV pressure  Pulmonary:TRIVIAL PRNo PS     ___________________________________________________________________________________________ INTERPRETATION NORMAL LEFT VENTRICULAR SYSTOLIC FUNCTION NORMAL RIGHT VENTRICULAR SYSTOLIC FUNCTION MILD VALVULAR REGURGITATION (See above) NO VALVULAR STENOSIS Closest EF: >55% (Estimated) Aortic: MILD AR Mitral: MILD MR Tricuspid: TRIVIAL TR LVH: MILD LVH   ___________________________________________________________________________________________ Electronically signed by: MD Mariel KanskyKen Fath on 08/26/2015 02:39 PM Performed By: Luretha MurphyMartin, Matthew, RDCS, RVT Ordering Physician: Harold HedgeFATH, KENNETH

## 2016-03-25 NOTE — Pre-Procedure Instructions (Signed)
Nasal swab results from today's PAT visit faxed to Dr. Rosita KeaMenz office.

## 2016-03-25 NOTE — Patient Instructions (Signed)
  Your procedure is scheduled EA:VWUJWJXon:Tuesday Dec. 26 , 2017. Report to Same Day Surgery. To find out your arrival time please call [phone] between 1PM - 3PM on Friday Dec. 22, 2017.  Remember: Instructions that are not followed completely may result in serious medical risk, up to and including death, or upon the discretion of your surgeon and anesthesiologist your surgery may need to be rescheduled.    _x___ 1. Do not eat food or drink liquids after midnight. No gum chewing or hard candies.     ____ 2. No Alcohol for 24 hours before or after surgery.   ____ 3. Bring all medications with you on the day of surgery if instructed.    __x__ 4. Notify your doctor if there is any change in your medical condition     (cold, fever, infections).    _____ 5. No smoking 24 hours prior to surgery.     Do not wear jewelry, make-up, hairpins, clips or nail polish.  Do not wear lotions, powders, or perfumes.   Do not shave 48 hours prior to surgery. Men may shave face and neck.  Do not bring valuables to the hospital.    [facility] is not responsible for any belongings or valuables.               Contacts, dentures or bridgework may not be worn into surgery.  Leave your suitcase in the car. After surgery it may be brought to your room.  For patients admitted to the hospital, discharge time is determined by your treatment team.   Patients discharged the day of surgery will not be allowed to drive home.    Please read over the following fact sheets that you were given:   [facility]  ____ Take these medicines the morning of surgery with A SIP OF WATER: NONE     ____ Fleet Enema (as directed)   _x__ Use CHG Soap as directed on instruction sheet  ____ Use inhalers on the day of surgery and bring to hospital day of surgery  ____ Stop metformin 2 days prior to surgery    ____ Take 1/2 of usual insulin dose the night before surgery and none on the morning of surgery.    ____ Stop Coumadin/Plavix/aspirin on does not apply.  _x___ Stop Anti-inflammatories such as Advil, Aleve, Ibuprofen, Motrin, Naproxen, Naprosyn, Goodies powders or aspirin products. OK to take Tylenol.   ____ Stop supplements until after surgery.    ____ Bring C-Pap to the hospital.

## 2016-03-26 LAB — URINE CULTURE: SPECIAL REQUESTS: NORMAL

## 2016-04-06 MED ORDER — CEFAZOLIN SODIUM-DEXTROSE 2-4 GM/100ML-% IV SOLN: 2.0000 g | Freq: Once | INTRAVENOUS | Status: DC

## 2016-04-06 MED ORDER — TRANEXAMIC ACID 1000 MG/10ML IV SOLN
1000.0000 mg | INTRAVENOUS | Status: AC
  Administered 2016-04-07: 1000 mg via INTRAVENOUS
  Filled 2016-04-06: qty 10

## 2016-04-07 ENCOUNTER — Encounter
Admission: RE | Disposition: A | Discharge status: Home Health | Payer: Self-pay | Source: Ambulatory Visit | Attending: Orthopedic Surgery

## 2016-04-07 ENCOUNTER — Encounter: Payer: Self-pay | Admitting: *Deleted

## 2016-04-07 ENCOUNTER — Inpatient Hospital Stay: Payer: BLUE CROSS/BLUE SHIELD | Admitting: Certified Registered Nurse Anesthetist

## 2016-04-07 ENCOUNTER — Inpatient Hospital Stay
Admission: RE | Admit: 2016-04-07 | Discharge: 2016-04-09 | DRG: 470 | Disposition: A | Discharge status: Home Health | Payer: BLUE CROSS/BLUE SHIELD | Source: Ambulatory Visit | Attending: Orthopedic Surgery | Admitting: Orthopedic Surgery

## 2016-04-07 ENCOUNTER — Inpatient Hospital Stay: Payer: BLUE CROSS/BLUE SHIELD

## 2016-04-07 DIAGNOSIS — Z89429 Acquired absence of other toe(s), unspecified side: Secondary | ICD-10-CM

## 2016-04-07 DIAGNOSIS — Z90710 Acquired absence of both cervix and uterus: Secondary | ICD-10-CM | POA: Diagnosis not present

## 2016-04-07 DIAGNOSIS — M6281 Muscle weakness (generalized): Secondary | ICD-10-CM

## 2016-04-07 DIAGNOSIS — M5136 Other intervertebral disc degeneration, lumbar region: Secondary | ICD-10-CM | POA: Diagnosis present

## 2016-04-07 DIAGNOSIS — R262 Difficulty in walking, not elsewhere classified: Secondary | ICD-10-CM

## 2016-04-07 DIAGNOSIS — Z87891 Personal history of nicotine dependence: Secondary | ICD-10-CM

## 2016-04-07 DIAGNOSIS — M25562 Pain in left knee: Secondary | ICD-10-CM

## 2016-04-07 DIAGNOSIS — G8918 Other acute postprocedural pain: Secondary | ICD-10-CM

## 2016-04-07 DIAGNOSIS — Z8701 Personal history of pneumonia (recurrent): Secondary | ICD-10-CM

## 2016-04-07 DIAGNOSIS — M1712 Unilateral primary osteoarthritis, left knee: Secondary | ICD-10-CM | POA: Diagnosis present

## 2016-04-07 HISTORY — PX: TOTAL KNEE ARTHROPLASTY: SHX125

## 2016-04-07 LAB — CBC
HCT: 39.2 % (ref 35.0–47.0)
Hemoglobin: 13.6 g/dL (ref 12.0–16.0)
MCH: 31.1 pg (ref 26.0–34.0)
MCHC: 34.8 g/dL (ref 32.0–36.0)
MCV: 89.3 fL (ref 80.0–100.0)
PLATELETS: 334 10*3/uL (ref 150–440)
RBC: 4.39 MIL/uL (ref 3.80–5.20)
RDW: 13.8 % (ref 11.5–14.5)
WBC: 14.3 10*3/uL — AB (ref 3.6–11.0)

## 2016-04-07 LAB — CREATININE, SERUM
CREATININE: 0.79 mg/dL (ref 0.44–1.00)
GFR calc Af Amer: >60 mL/min (ref >60)
GFR calc non Af Amer: >60 mL/min (ref >60)

## 2016-04-07 LAB — ABO/RH: ABO/RH(D): B NEG

## 2016-04-07 SURGERY — ARTHROPLASTY, KNEE, TOTAL
Anesthesia: General | Laterality: Left | Wound class: Clean

## 2016-04-07 MED ORDER — ALUM & MAG HYDROXIDE-SIMETH 200-200-20 MG/5ML PO SUSP: 30.0000 mL | ORAL | Status: DC | PRN

## 2016-04-07 MED ORDER — SUGAMMADEX SODIUM 200 MG/2ML IV SOLN
INTRAVENOUS | Status: AC
Start: 2016-04-07 — End: 2016-04-07
  Filled 2016-04-07: qty 2

## 2016-04-07 MED ORDER — LACTATED RINGERS IV SOLN
INTRAVENOUS | Status: DC
  Administered 2016-04-07: 07:00:00 via INTRAVENOUS

## 2016-04-07 MED ORDER — DOCUSATE SODIUM 100 MG PO CAPS
100.0000 mg | ORAL_CAPSULE | Freq: Two times a day (BID) | ORAL | Status: DC
  Administered 2016-04-07 – 2016-04-09 (×5): 100 mg via ORAL
  Filled 2016-04-07 (×5): qty 1

## 2016-04-07 MED ORDER — ZOLPIDEM TARTRATE 5 MG PO TABS: 5.0000 mg | ORAL_TABLET | Freq: Every evening | ORAL | Status: DC | PRN

## 2016-04-07 MED ORDER — BUPIVACAINE LIPOSOME 1.3 % IJ SUSP
INTRAMUSCULAR | Status: AC
  Filled 2016-04-07: qty 20

## 2016-04-07 MED ORDER — ONDANSETRON HCL 4 MG/2ML IJ SOLN: 4.0000 mg | Freq: Four times a day (QID) | INTRAMUSCULAR | Status: DC | PRN

## 2016-04-07 MED ORDER — LABETALOL HCL 5 MG/ML IV SOLN
INTRAVENOUS | Status: AC
  Filled 2016-04-07: qty 4

## 2016-04-07 MED ORDER — ROCURONIUM BROMIDE 100 MG/10ML IV SOLN
INTRAVENOUS | Status: DC | PRN
  Administered 2016-04-07: 20 mg via INTRAVENOUS
  Administered 2016-04-07: 30 mg via INTRAVENOUS

## 2016-04-07 MED ORDER — SODIUM CHLORIDE 0.9 % IJ SOLN
INTRAMUSCULAR | Status: AC
  Filled 2016-04-07: qty 100

## 2016-04-07 MED ORDER — MIDAZOLAM HCL 2 MG/2ML IJ SOLN
INTRAMUSCULAR | Status: DC | PRN
  Administered 2016-04-07: 2 mg via INTRAVENOUS

## 2016-04-07 MED ORDER — DEXAMETHASONE SODIUM PHOSPHATE 10 MG/ML IJ SOLN
INTRAMUSCULAR | Status: DC | PRN
  Administered 2016-04-07: 10 mg via INTRAVENOUS

## 2016-04-07 MED ORDER — SODIUM CHLORIDE 0.9 % IV SOLN
INTRAVENOUS | Status: DC
  Administered 2016-04-07 (×2): via INTRAVENOUS

## 2016-04-07 MED ORDER — ACETAMINOPHEN 650 MG RE SUPP: 650.0000 mg | Freq: Four times a day (QID) | RECTAL | Status: DC | PRN

## 2016-04-07 MED ORDER — PHENYLEPHRINE HCL 10 MG/ML IJ SOLN
INTRAMUSCULAR | Status: DC | PRN
  Administered 2016-04-07 (×3): 100 ug via INTRAVENOUS
  Administered 2016-04-07: 150 ug via INTRAVENOUS
  Administered 2016-04-07 (×2): 80 ug via INTRAVENOUS
  Administered 2016-04-07: 40 ug via INTRAVENOUS
  Administered 2016-04-07: 100 ug via INTRAVENOUS
  Administered 2016-04-07 (×2): 80 ug via INTRAVENOUS

## 2016-04-07 MED ORDER — METOCLOPRAMIDE HCL 5 MG/ML IJ SOLN: 5.0000 mg | Freq: Three times a day (TID) | INTRAMUSCULAR | Status: DC | PRN

## 2016-04-07 MED ORDER — CLINDAMYCIN PHOSPHATE 900 MG/50ML IV SOLN
900.0000 mg | Freq: Four times a day (QID) | INTRAVENOUS | Status: AC
  Administered 2016-04-07 – 2016-04-08 (×4): 900 mg via INTRAVENOUS
  Filled 2016-04-07 (×4): qty 50

## 2016-04-07 MED ORDER — PHENYLEPHRINE 40 MCG/ML (10ML) SYRINGE FOR IV PUSH (FOR BLOOD PRESSURE SUPPORT)
PREFILLED_SYRINGE | INTRAVENOUS | Status: AC
  Filled 2016-04-07: qty 10

## 2016-04-07 MED ORDER — CYCLOBENZAPRINE HCL 10 MG PO TABS
10.0000 mg | ORAL_TABLET | Freq: Every evening | ORAL | Status: DC | PRN
  Administered 2016-04-07: 10 mg via ORAL
  Filled 2016-04-07: qty 1

## 2016-04-07 MED ORDER — PROPOFOL 10 MG/ML IV BOLUS
INTRAVENOUS | Status: DC | PRN
  Administered 2016-04-07: 150 mg via INTRAVENOUS

## 2016-04-07 MED ORDER — ONDANSETRON HCL 4 MG PO TABS: 4.0000 mg | ORAL_TABLET | Freq: Four times a day (QID) | ORAL | Status: DC | PRN

## 2016-04-07 MED ORDER — HYDROCHLOROTHIAZIDE 25 MG PO TABS: 25.0000 mg | ORAL_TABLET | Freq: Every day | ORAL | Status: DC | PRN

## 2016-04-07 MED ORDER — KETOROLAC TROMETHAMINE 30 MG/ML IJ SOLN
INTRAMUSCULAR | Status: AC
  Filled 2016-04-07: qty 1

## 2016-04-07 MED ORDER — NEOMYCIN-POLYMYXIN B GU 40-200000 IR SOLN
Status: AC
  Filled 2016-04-07: qty 2

## 2016-04-07 MED ORDER — ACETAMINOPHEN 325 MG PO TABS: 650.0000 mg | ORAL_TABLET | Freq: Four times a day (QID) | ORAL | Status: DC | PRN

## 2016-04-07 MED ORDER — METOCLOPRAMIDE HCL 10 MG PO TABS: 5.0000 mg | ORAL_TABLET | Freq: Three times a day (TID) | ORAL | Status: DC | PRN

## 2016-04-07 MED ORDER — MORPHINE SULFATE (PF) 2 MG/ML IV SOLN
2.0000 mg | INTRAVENOUS | Status: DC | PRN
  Administered 2016-04-08: 2 mg via INTRAVENOUS
  Filled 2016-04-07: qty 1

## 2016-04-07 MED ORDER — CLINDAMYCIN PHOSPHATE 600 MG/50ML IV SOLN
INTRAVENOUS | Status: AC
  Filled 2016-04-07: qty 50

## 2016-04-07 MED ORDER — MAGNESIUM HYDROXIDE 400 MG/5ML PO SUSP
30.0000 mL | Freq: Every day | ORAL | Status: DC | PRN
  Administered 2016-04-08: 30 mL via ORAL
  Filled 2016-04-07: qty 30

## 2016-04-07 MED ORDER — FENTANYL CITRATE (PF) 100 MCG/2ML IJ SOLN
INTRAMUSCULAR | Status: DC | PRN
  Administered 2016-04-07: 100 ug via INTRAVENOUS

## 2016-04-07 MED ORDER — METHOCARBAMOL 500 MG PO TABS
500.0000 mg | ORAL_TABLET | Freq: Four times a day (QID) | ORAL | Status: DC | PRN
  Administered 2016-04-07 – 2016-04-09 (×6): 500 mg via ORAL
  Filled 2016-04-07 (×7): qty 1

## 2016-04-07 MED ORDER — NEOMYCIN-POLYMYXIN B GU 40-200000 IR SOLN
Status: DC | PRN
  Administered 2016-04-07: 16 mL

## 2016-04-07 MED ORDER — SODIUM CHLORIDE 0.9 % IV SOLN
INTRAVENOUS | Status: DC | PRN
  Administered 2016-04-07: 30 ug/min via INTRAVENOUS

## 2016-04-07 MED ORDER — LIDOCAINE HCL (CARDIAC) 20 MG/ML IV SOLN
INTRAVENOUS | Status: DC | PRN
  Administered 2016-04-07: 80 mg via INTRAVENOUS

## 2016-04-07 MED ORDER — METHOCARBAMOL 1000 MG/10ML IJ SOLN
500.0000 mg | Freq: Four times a day (QID) | INTRAVENOUS | Status: DC | PRN
  Filled 2016-04-07: qty 5

## 2016-04-07 MED ORDER — SUGAMMADEX SODIUM 200 MG/2ML IV SOLN
INTRAVENOUS | Status: DC | PRN
  Administered 2016-04-07: 175 mg via INTRAVENOUS

## 2016-04-07 MED ORDER — MORPHINE SULFATE 10 MG/ML IJ SOLN
INTRAMUSCULAR | Status: DC | PRN
  Administered 2016-04-07: 10 mg via SUBCUTANEOUS

## 2016-04-07 MED ORDER — MORPHINE SULFATE (PF) 10 MG/ML IV SOLN
INTRAVENOUS | Status: AC
  Filled 2016-04-07: qty 1

## 2016-04-07 MED ORDER — ACETAMINOPHEN 10 MG/ML IV SOLN
INTRAVENOUS | Status: DC | PRN
  Administered 2016-04-07: 1000 mg via INTRAVENOUS

## 2016-04-07 MED ORDER — CLINDAMYCIN PHOSPHATE 600 MG/50ML IV SOLN
INTRAVENOUS | Status: DC | PRN
  Administered 2016-04-07: 600 mg via INTRAVENOUS

## 2016-04-07 MED ORDER — BUPIVACAINE-EPINEPHRINE (PF) 0.25% -1:200000 IJ SOLN
INTRAMUSCULAR | Status: AC
  Filled 2016-04-07: qty 30

## 2016-04-07 MED ORDER — SODIUM CHLORIDE 0.9 % IV SOLN
INTRAVENOUS | Status: DC | PRN
  Administered 2016-04-07: 60 mL

## 2016-04-07 MED ORDER — LIDOCAINE 2% (20 MG/ML) 5 ML SYRINGE
INTRAMUSCULAR | Status: AC
  Filled 2016-04-07: qty 5

## 2016-04-07 MED ORDER — DIPHENHYDRAMINE HCL 50 MG/ML IJ SOLN
INTRAMUSCULAR | Status: DC | PRN
  Administered 2016-04-07: 50 mg via INTRAVENOUS

## 2016-04-07 MED ORDER — DIPHENHYDRAMINE HCL 12.5 MG/5ML PO ELIX: 12.5000 mg | ORAL_SOLUTION | ORAL | Status: DC | PRN

## 2016-04-07 MED ORDER — ONDANSETRON HCL 4 MG/2ML IJ SOLN: 4.0000 mg | Freq: Once | INTRAMUSCULAR | Status: DC | PRN

## 2016-04-07 MED ORDER — OXYCODONE HCL 5 MG PO TABS
5.0000 mg | ORAL_TABLET | ORAL | Status: DC | PRN
  Administered 2016-04-07 – 2016-04-09 (×13): 10 mg via ORAL
  Filled 2016-04-07 (×13): qty 2

## 2016-04-07 MED ORDER — HYDROMORPHONE HCL 1 MG/ML IJ SOLN
INTRAMUSCULAR | Status: AC
  Filled 2016-04-07: qty 1

## 2016-04-07 MED ORDER — DEXAMETHASONE SODIUM PHOSPHATE 10 MG/ML IJ SOLN
INTRAMUSCULAR | Status: AC
  Filled 2016-04-07: qty 1

## 2016-04-07 MED ORDER — BUPIVACAINE-EPINEPHRINE (PF) 0.25% -1:200000 IJ SOLN
INTRAMUSCULAR | Status: DC | PRN
  Administered 2016-04-07: 30 mL

## 2016-04-07 MED ORDER — FAMOTIDINE 20 MG PO TABS
ORAL_TABLET | ORAL | Status: AC
  Administered 2016-04-07: 20 mg via ORAL
  Filled 2016-04-07: qty 1

## 2016-04-07 MED ORDER — ENOXAPARIN SODIUM 30 MG/0.3ML ~~LOC~~ SOLN
30.0000 mg | Freq: Two times a day (BID) | SUBCUTANEOUS | Status: DC
  Administered 2016-04-08 – 2016-04-09 (×3): 30 mg via SUBCUTANEOUS
  Filled 2016-04-07 (×3): qty 0.3

## 2016-04-07 MED ORDER — PROPOFOL 500 MG/50ML IV EMUL
INTRAVENOUS | Status: AC
  Filled 2016-04-07: qty 50

## 2016-04-07 MED ORDER — PHENOL 1.4 % MT LIQD
1.0000 | OROMUCOSAL | Status: DC | PRN
  Filled 2016-04-07: qty 177

## 2016-04-07 MED ORDER — ROCURONIUM BROMIDE 50 MG/5ML IV SOSY
PREFILLED_SYRINGE | INTRAVENOUS | Status: AC
  Filled 2016-04-07: qty 5

## 2016-04-07 MED ORDER — LABETALOL HCL 5 MG/ML IV SOLN
INTRAVENOUS | Status: DC | PRN
  Administered 2016-04-07: 5 mg via INTRAVENOUS

## 2016-04-07 MED ORDER — PHENYLEPHRINE HCL 10 MG/ML IJ SOLN
INTRAMUSCULAR | Status: AC
  Filled 2016-04-07: qty 1

## 2016-04-07 MED ORDER — PROPOFOL 10 MG/ML IV BOLUS
INTRAVENOUS | Status: AC
  Filled 2016-04-07: qty 20

## 2016-04-07 MED ORDER — MAGNESIUM CITRATE PO SOLN
1.0000 | Freq: Once | ORAL | Status: DC | PRN
  Filled 2016-04-07: qty 296

## 2016-04-07 MED ORDER — FENTANYL CITRATE (PF) 100 MCG/2ML IJ SOLN
INTRAMUSCULAR | Status: AC
  Filled 2016-04-07: qty 2

## 2016-04-07 MED ORDER — FAMOTIDINE 20 MG PO TABS
20.0000 mg | ORAL_TABLET | Freq: Once | ORAL | Status: AC
  Administered 2016-04-07: 20 mg via ORAL

## 2016-04-07 MED ORDER — HYDROMORPHONE HCL 1 MG/ML IJ SOLN
INTRAMUSCULAR | Status: DC | PRN
  Administered 2016-04-07 (×2): 0.5 mg via INTRAVENOUS

## 2016-04-07 MED ORDER — ONDANSETRON HCL 4 MG/2ML IJ SOLN
INTRAMUSCULAR | Status: AC
  Filled 2016-04-07: qty 2

## 2016-04-07 MED ORDER — LORATADINE 10 MG PO TABS
10.0000 mg | ORAL_TABLET | Freq: Every day | ORAL | Status: DC
  Administered 2016-04-07 – 2016-04-09 (×3): 10 mg via ORAL
  Filled 2016-04-07 (×3): qty 1

## 2016-04-07 MED ORDER — NEOMYCIN-POLYMYXIN B GU 40-200000 IR SOLN
Status: AC
  Filled 2016-04-07: qty 20

## 2016-04-07 MED ORDER — BISACODYL 10 MG RE SUPP
10.0000 mg | Freq: Every day | RECTAL | Status: DC | PRN
  Administered 2016-04-08: 10 mg via RECTAL
  Filled 2016-04-07: qty 1

## 2016-04-07 MED ORDER — MENTHOL 3 MG MT LOZG
1.0000 | LOZENGE | OROMUCOSAL | Status: DC | PRN
  Filled 2016-04-07: qty 9

## 2016-04-07 MED ORDER — DIPHENHYDRAMINE HCL 50 MG/ML IJ SOLN
INTRAMUSCULAR | Status: AC
  Filled 2016-04-07: qty 1

## 2016-04-07 MED ORDER — FENTANYL CITRATE (PF) 100 MCG/2ML IJ SOLN
INTRAMUSCULAR | Status: AC
Start: 2016-04-07 — End: 2016-04-07
  Filled 2016-04-07: qty 2

## 2016-04-07 MED ORDER — MIDAZOLAM HCL 2 MG/2ML IJ SOLN
INTRAMUSCULAR | Status: AC
  Filled 2016-04-07: qty 2

## 2016-04-07 MED ORDER — BUPIVACAINE HCL (PF) 0.5 % IJ SOLN
INTRAMUSCULAR | Status: AC
  Filled 2016-04-07: qty 30

## 2016-04-07 MED ORDER — KETOROLAC TROMETHAMINE 30 MG/ML IJ SOLN
INTRAMUSCULAR | Status: DC | PRN
  Administered 2016-04-07: 30 mg via INTRAVENOUS

## 2016-04-07 MED ORDER — FENTANYL CITRATE (PF) 100 MCG/2ML IJ SOLN
25.0000 ug | INTRAMUSCULAR | Status: DC | PRN
  Administered 2016-04-07 (×5): 25 ug via INTRAVENOUS

## 2016-04-07 MED ORDER — ACETAMINOPHEN 10 MG/ML IV SOLN
INTRAVENOUS | Status: AC
  Filled 2016-04-07: qty 100

## 2016-04-07 SURGICAL SUPPLY — 60 items
BANDAGE ACE 6X5 VEL STRL LF (GAUZE/BANDAGES/DRESSINGS) ×3 IMPLANT
BLADE SAW 1 (BLADE) ×3 IMPLANT
BLOCK CUTTING FEMUR 3+ LT MED (MISCELLANEOUS) IMPLANT
BLOCK CUTTING TIBIAL 2 LT (MISCELLANEOUS) IMPLANT
CANISTER SUCT 1200ML W/VALVE (MISCELLANEOUS) ×3 IMPLANT
CANISTER SUCT 3000ML (MISCELLANEOUS) ×6 IMPLANT
CAPT KNEE TOTAL 3 ×3 IMPLANT
CATH FOL LEG HOLDER (MISCELLANEOUS) ×3 IMPLANT
CATH TRAY METER 16FR LF (MISCELLANEOUS) ×3 IMPLANT
CEMENT HV SMART SET (Cement) ×6 IMPLANT
CHLORAPREP W/TINT 26ML (MISCELLANEOUS) ×6 IMPLANT
COOLER POLAR GLACIER W/PUMP (MISCELLANEOUS) ×3 IMPLANT
CUFF TOURN 24 STER (MISCELLANEOUS) IMPLANT
CUFF TOURN 30 STER DUAL PORT (MISCELLANEOUS) IMPLANT
DRAPE INCISE IOBAN 66X45 STRL (DRAPES) ×6 IMPLANT
DRAPE SHEET LG 3/4 BI-LAMINATE (DRAPES) ×6 IMPLANT
ELECT CAUTERY BLADE 6.4 (BLADE) ×3 IMPLANT
ELECT REM PT RETURN 9FT ADLT (ELECTROSURGICAL) ×3
ELECTRODE REM PT RTRN 9FT ADLT (ELECTROSURGICAL) ×1 IMPLANT
GAUZE PETRO XEROFOAM 1X8 (MISCELLANEOUS) ×3 IMPLANT
GAUZE SPONGE 4X4 12PLY STRL (GAUZE/BANDAGES/DRESSINGS) ×3 IMPLANT
GLOVE BIOGEL PI IND STRL 9 (GLOVE) ×1 IMPLANT
GLOVE BIOGEL PI INDICATOR 9 (GLOVE) ×2
GLOVE INDICATOR 8.0 STRL GRN (GLOVE) ×3 IMPLANT
GLOVE SURG ORTHO 8.0 STRL STRW (GLOVE) ×3 IMPLANT
GLOVE SURG SYN 9.0  PF PI (GLOVE) ×2
GLOVE SURG SYN 9.0 PF PI (GLOVE) ×1 IMPLANT
GOWN SRG 2XL LVL 4 RGLN SLV (GOWNS) ×1 IMPLANT
GOWN STRL NON-REIN 2XL LVL4 (GOWNS) ×2
GOWN STRL REUS W/ TWL LRG LVL3 (GOWN DISPOSABLE) ×1 IMPLANT
GOWN STRL REUS W/ TWL XL LVL3 (GOWN DISPOSABLE) ×1 IMPLANT
GOWN STRL REUS W/TWL LRG LVL3 (GOWN DISPOSABLE) ×2
GOWN STRL REUS W/TWL XL LVL3 (GOWN DISPOSABLE) ×2
HANDPIECE INTERPULSE COAX TIP (DISPOSABLE) ×2
HOOD PEEL AWAY FLYTE STAYCOOL (MISCELLANEOUS) ×6 IMPLANT
IMMBOLIZER KNEE 19 BLUE UNIV (SOFTGOODS) ×3 IMPLANT
KIT RM TURNOVER STRD PROC AR (KITS) ×3 IMPLANT
KNEE MEDACTA TIBIAL/FEMORAL BL (Knees) ×3 IMPLANT
KNIFE SCULPS 14X20 (INSTRUMENTS) ×3 IMPLANT
NDL SAFETY 18GX1.5 (NEEDLE) ×3 IMPLANT
NEEDLE SPNL 18GX3.5 QUINCKE PK (NEEDLE) ×3 IMPLANT
NEEDLE SPNL 20GX3.5 QUINCKE YW (NEEDLE) ×3 IMPLANT
NS IRRIG 1000ML POUR BTL (IV SOLUTION) ×3 IMPLANT
PACK TOTAL KNEE (MISCELLANEOUS) ×3 IMPLANT
PAD WRAPON POLAR KNEE (MISCELLANEOUS) ×1 IMPLANT
SET HNDPC FAN SPRY TIP SCT (DISPOSABLE) ×1 IMPLANT
SOL .9 NS 3000ML IRR  AL (IV SOLUTION) ×2
SOL .9 NS 3000ML IRR UROMATIC (IV SOLUTION) ×1 IMPLANT
STAPLER SKIN PROX 35W (STAPLE) ×3 IMPLANT
SUCTION FRAZIER HANDLE 10FR (MISCELLANEOUS) ×2
SUCTION TUBE FRAZIER 10FR DISP (MISCELLANEOUS) ×1 IMPLANT
SUT DVC 2 QUILL PDO  T11 36X36 (SUTURE) ×2
SUT DVC 2 QUILL PDO T11 36X36 (SUTURE) ×1 IMPLANT
SUT V-LOC 90 ABS DVC 3-0 CL (SUTURE) ×3 IMPLANT
SYR 20CC LL (SYRINGE) ×3 IMPLANT
SYR 50ML LL SCALE MARK (SYRINGE) ×6 IMPLANT
TIBIAL BONE MODEL LEFT (MISCELLANEOUS) IMPLANT
TOWEL OR 17X26 4PK STRL BLUE (TOWEL DISPOSABLE) ×3 IMPLANT
TOWER CARTRIDGE SMART MIX (DISPOSABLE) ×3 IMPLANT
WRAPON POLAR PAD KNEE (MISCELLANEOUS) ×3

## 2016-04-07 NOTE — Care Management (Signed)
Attempted to speak with patient regarding discharge plan. Patient is resting with eyes closed. Will reassess in the am.

## 2016-04-07 NOTE — OR Nursing (Signed)
2 grams Ancef started, left leg became red splotchy, stopped Ancef

## 2016-04-07 NOTE — Plan of Care (Signed)
Problem: Bowel/Gastric: Goal: Will not experience complications related to bowel motility Outcome: Progressing Pt is progressing toward discharge.   

## 2016-04-07 NOTE — Anesthesia Preprocedure Evaluation (Signed)
Anesthesia Evaluation  Patient identified by MRN, date of birth, ID band Patient awake    Reviewed: Allergy & Precautions, H&P , NPO status , Patient's Chart, lab work & pertinent test results, reviewed documented beta blocker date and time   Airway Mallampati: II  TM Distance: >3 FB Neck ROM: full    Dental  (+) Teeth Intact   Pulmonary neg pulmonary ROS, neg shortness of breath, pneumonia, resolved, former smoker,    Pulmonary exam normal        Cardiovascular negative cardio ROS Normal cardiovascular exam Rhythm:regular Rate:Normal     Neuro/Psych PSYCHIATRIC DISORDERS Hx of lbp sp lami. occ lbp intermittently.  ja negative neurological ROS  negative psych ROS   GI/Hepatic negative GI ROS, Neg liver ROS,   Endo/Other  negative endocrine ROS  Renal/GU negative Renal ROS  negative genitourinary   Musculoskeletal   Abdominal   Peds  Hematology negative hematology ROS (+)   Anesthesia Other Findings Past Medical History: No date: Arthritis     Comment: back, hips, knees No date: Degenerative disc disease, lumbar     Comment: L5 2017: Depression     Comment: situational death of parents 2012: Pneumonia Past Surgical History: No date: ABDOMINAL HYSTERECTOMY 01/29/2016: ARTHRODESIS METATARSALPHALANGEAL JOINT (MTPJ) Right     Comment: Procedure: ARTHRODESIS 1ST               METATARSALPHALANGEAL JOINT (MTPJ);  Surgeon:               Gwyneth RevelsJustin Fowler, DPM;  Location: [facility];  Service: Podiatry;  Laterality: Right; No date: BACK SURGERY No date: CHOLECYSTECTOMY No date: excision of redundant skin No date: FOOT SURGERY 01/29/2016: HARDWARE REMOVAL Right     Comment: Procedure: HARDWARE REMOVAL;  Surgeon: Gwyneth RevelsJustin               Fowler, DPM;  [facility];                Service: Podiatry;  Laterality: Right;  PLATE               AND 5 SCREWS REMOVED No date: MENISCUS  REPAIR No date: ROTATOR CUFF REPAIR No date: toe amputation No date: TONSILLECTOMY   Reproductive/Obstetrics negative OB ROS                             Anesthesia Physical Anesthesia Plan  ASA: II  Anesthesia Plan: General ETT   Post-op Pain Management:    Induction:   Airway Management Planned:   Additional Equipment:   Intra-op Plan:   Post-operative Plan:   Informed Consent: I have reviewed the patients History and Physical, chart, labs and discussed the procedure including the risks, benefits and alternatives for the proposed anesthesia with the patient or authorized representative who has indicated his/her understanding and acceptance.   Dental Advisory Given  Plan Discussed with: CRNA  Anesthesia Plan Comments:         Anesthesia Quick Evaluation

## 2016-04-07 NOTE — Evaluation (Signed)
Physical Therapy Evaluation Patient Details Name: Belinda HarmanCynthia Mitchell MRN: [id number] DOB: Sep 10, 1955 Today's Date: 04/07/2016   History of Present Illness  admitted for acute hospitalization status post L TKR, 04/07/16, WBAT.  Clinical Impression  Upon evaluation, patient alert and oriented; follows all commands and demonstrates excellent participation/effort with all therex/theract.  Demonstrates at least 3-/5 strength throughout L LE with no sensory deficit reported.  Tolerating 7-110 degrees act assist ROM with minimal increase in pain.  Strong L quad activation with good control, minimal/no lag noted with SLR (able to complete x10 without difficulty). Able to complete bed mobility with mod indep; sit/stand, basic transfers and gait (40') with RW, cga.  Good L knee control, WBing and overall stability with all functional mobility.  Anticipate good progression towards all mobility goals. Would benefit from skilled PT to address above deficits and promote optimal return to PLOF; Recommend transition to HHPT upon discharge from acute hospitalization.     Follow Up Recommendations Home health PT    Equipment Recommendations  Rolling walker with 5" wheels    Recommendations for Other Services       Precautions / Restrictions Precautions Precautions: Fall Restrictions Weight Bearing Restrictions: Yes LLE Weight Bearing: Weight bearing as tolerated      Mobility  Bed Mobility Overal bed mobility: Modified Independent             General bed mobility comments: Able to indep negotiate L LE over edge of bed without difficulty  Transfers Overall transfer level: Needs assistance Equipment used: Rolling walker (2 wheeled) Transfers: Sit to/from Stand Sit to Stand: Min guard;Supervision         General transfer comment: cuing for hand placement; good functional use/integration of L LE into movement transition  Ambulation/Gait Ambulation/Gait assistance: Min guard Ambulation  Distance (Feet): 40 Feet Assistive device: Rolling walker (2 wheeled)       General Gait Details: reciprocal stepping pattern with improved step height/length throughout distances.  Good stance time, WBing and stability in L LE with minimal reports of pain increase with WBing  Stairs            Wheelchair Mobility    Modified Rankin (Stroke Patients Only)       Balance Overall balance assessment: Needs assistance Sitting-balance support: Feet supported;No upper extremity supported Sitting balance-Leahy Scale: Good     Standing balance support: Bilateral upper extremity supported Standing balance-Leahy Scale: Good                               Pertinent Vitals/Pain Pain Assessment: 0-10 Pain Score: 5  Pain Location: L knee Pain Descriptors / Indicators: Aching;Sore Pain Intervention(s): Limited activity within patient's tolerance;Monitored during session;Premedicated before session;Repositioned    Home Living Family/patient expects to be discharged to:: Private residence Living Arrangements: Alone   Type of Home: House Home Access: Stairs to enter Entrance Stairs-Rails: None Entrance Stairs-Number of Steps: 3 Home Layout: One level        Prior Function Level of Independence: Independent         Comments: Indep with ADL, household and community distances; very active (push mowing yard, enjoys yardwork, has 4 dogs); endorses 2 falls within previous six months (relates to previous foot surgery)     Hand Dominance        Extremity/Trunk Assessment   Upper Extremity Assessment Upper Extremity Assessment: Overall WFL for tasks assessed    Lower Extremity Assessment Lower Extremity Assessment:  (  R LE grossly WFL; L LE grossly 5/5 throughout hip, 3-/5 throughout knee and 4-/5 at ankle.  Denies sensory deficit)       Communication   Communication: No difficulties  Cognition Arousal/Alertness: Awake/alert Behavior During Therapy: WFL for  tasks assessed/performed Overall Cognitive Status: Within Functional Limits for tasks assessed                      General Comments      Exercises Total Joint Exercises Goniometric ROM: 7-110 degrees, act assist Other Exercises Other Exercises: Supine LE therex, 1x10, AROM for muscular strength/endurance: ankle pumps, quad sets, SAQs, heel slides, hip abduct/adduct, SLR.  Excellent quad activation and control; very strong SLR with no lag noted.   Assessment/Plan    PT Assessment Patient needs continued PT services  PT Problem List Decreased strength;Decreased range of motion;Decreased activity tolerance;Decreased balance;Decreased mobility;Pain;Decreased skin integrity          PT Treatment Interventions DME instruction;Gait training;Stair training;Functional mobility training;Therapeutic activities;Therapeutic exercise;Balance training;Patient/family education    PT Goals (Current goals can be found in the Care Plan section)  Acute Rehab PT Goals Patient Stated Goal: to return home by Friday PT Goal Formulation: With patient Time For Goal Achievement: 04/21/16 Potential to Achieve Goals: Good    Frequency BID   Barriers to discharge        Co-evaluation               End of Session Equipment Utilized During Treatment: Gait belt Activity Tolerance: Patient tolerated treatment well Patient left: in chair;with call bell/phone within reach;with chair alarm set Nurse Communication: Mobility status         Time: [id number] PT Time Calculation (min) (ACUTE ONLY): 32 min   Charges:   PT Evaluation $PT Eval Low Complexity: 1 Procedure PT Treatments $Therapeutic Exercise: 8-22 mins   PT G Codes:        Emelyn Roen H. Manson PasseyBrown, PT, DPT, NCS 04/07/16, 4:10 PM [phone]

## 2016-04-07 NOTE — Progress Notes (Signed)
Pt has been oob to chair with physical therapy and has returned to bed, received robaxin and oxycodone when L knee was placed in bone foam. Pt is tolerating regular diet, nearly independent with walker to transfer. Pt has call bell in reach.

## 2016-04-07 NOTE — [place] FL2 (Signed)
[place] MEDICAID FL2 LEVEL OF CARE SCREENING TOOL     IDENTIFICATION  Patient Name: Belinda Mitchell Birthdate: August 02, 1955 Sex: female Admission Date (Current Location): 04/07/2016  [place]   Facility and Address:  [facility], [address], [place], [place] [address]      Provider Number: [id number]  Attending Physician Name and Address:  Kennedy BuckerMichael Menz, MD  Relative Name and Phone Number:       Current Level of Care: Hospital Recommended Level of Care: Skilled Nursing Facility Prior Approval Number:    Date Approved/Denied:   PASRR Number:  [id number])  Discharge Plan: SNF    Current Diagnoses: Patient Active Problem List   Diagnosis Date Noted  . Primary osteoarthritis of left knee 04/07/2016  Osteoarthritis  Orientation RESPIRATION BLADDER Height & Weight     Self, Time, Situation, Place  Normal Continent Weight:   Height:     BEHAVIORAL SYMPTOMS/MOOD NEUROLOGICAL BOWEL NUTRITION STATUS   (none)  (none) Continent Diet (Regular Diet )  AMBULATORY STATUS COMMUNICATION OF NEEDS Skin   Extensive Assist Verbally Surgical wounds (Incision: Left Knee )                       Personal Care Assistance Level of Assistance  Bathing, Feeding, Dressing Bathing Assistance: Limited assistance Feeding assistance: Independent Dressing Assistance: Limited assistance     Functional Limitations Info  Sight, Hearing, Speech Sight Info: Adequate Hearing Info: Adequate Speech Info: Adequate    SPECIAL CARE FACTORS FREQUENCY  PT (By licensed PT), OT (By licensed OT)     PT Frequency:  (5) OT Frequency:  (5)            Contractures      Additional Factors Info  Code Status, Allergies Code Status Info:  (Full Code. ) Allergies Info:  (No Known Allergies. )           Current Medications (04/07/2016):  This is the current hospital active medication list Current Facility-Administered Medications   Medication Dose Route Frequency Provider Last Rate Last Dose  . 0.9 %  sodium chloride infusion   Intravenous Continuous Kennedy BuckerMichael Menz, MD 75 mL/hr at 04/07/16 1132    . acetaminophen (TYLENOL) tablet 650 mg  650 mg Oral Q6H PRN Kennedy BuckerMichael Menz, MD       Or  . acetaminophen (TYLENOL) suppository 650 mg  650 mg Rectal Q6H PRN Kennedy BuckerMichael Menz, MD      . alum & mag hydroxide-simeth (MAALOX/MYLANTA) 200-200-20 MG/5ML suspension 30 mL  30 mL Oral Q4H PRN Kennedy BuckerMichael Menz, MD      . bisacodyl (DULCOLAX) suppository 10 mg  10 mg Rectal Daily PRN Kennedy BuckerMichael Menz, MD      . clindamycin (CLEOCIN) IVPB 900 mg  900 mg Intravenous Q6H Kennedy BuckerMichael Menz, MD   900 mg at 04/07/16 1402  . cyclobenzaprine (FLEXERIL) tablet 10 mg  10 mg Oral QHS PRN Kennedy BuckerMichael Menz, MD      . diphenhydrAMINE (BENADRYL) 12.5 MG/5ML elixir 12.5-25 mg  12.5-25 mg Oral Q4H PRN Kennedy BuckerMichael Menz, MD      . docusate sodium (COLACE) capsule 100 mg  100 mg Oral BID Kennedy BuckerMichael Menz, MD   100 mg at 04/07/16 1211  . [START ON 04/08/2016] enoxaparin (LOVENOX) injection 30 mg  30 mg Subcutaneous Q12H Kennedy BuckerMichael Menz, MD      . fentaNYL (SUBLIMAZE) 100 MCG/2ML injection           . fentaNYL (SUBLIMAZE)  100 MCG/2ML injection           . hydrochlorothiazide (HYDRODIURIL) tablet 25 mg  25 mg Oral Daily PRN Kennedy BuckerMichael Menz, MD      . loratadine (CLARITIN) tablet 10 mg  10 mg Oral Daily Kennedy BuckerMichael Menz, MD   10 mg at 04/07/16 1211  . magnesium citrate solution 1 Bottle  1 Bottle Oral Once PRN Kennedy BuckerMichael Menz, MD      . magnesium hydroxide (MILK OF MAGNESIA) suspension 30 mL  30 mL Oral Daily PRN Kennedy BuckerMichael Menz, MD      . menthol-cetylpyridinium (CEPACOL) lozenge 3 mg  1 lozenge Oral PRN Kennedy BuckerMichael Menz, MD       Or  . phenol (CHLORASEPTIC) mouth spray 1 spray  1 spray Mouth/Throat PRN Kennedy BuckerMichael Menz, MD      . methocarbamol (ROBAXIN) tablet 500 mg  500 mg Oral Q6H PRN Kennedy BuckerMichael Menz, MD       Or  . methocarbamol (ROBAXIN) 500 mg in dextrose 5 % 50 mL IVPB  500 mg Intravenous Q6H PRN Kennedy BuckerMichael  Menz, MD      . metoCLOPramide (REGLAN) tablet 5-10 mg  5-10 mg Oral Q8H PRN Kennedy BuckerMichael Menz, MD       Or  . metoCLOPramide (REGLAN) injection 5-10 mg  5-10 mg Intravenous Q8H PRN Kennedy BuckerMichael Menz, MD      . morphine 2 MG/ML injection 2 mg  2 mg Intravenous Q1H PRN Kennedy BuckerMichael Menz, MD      . ondansetron [facility](ZOFRAN) tablet 4 mg  4 mg Oral Q6H PRN Kennedy BuckerMichael Menz, MD       Or  . ondansetron [facility](ZOFRAN) injection 4 mg  4 mg Intravenous Q6H PRN Kennedy BuckerMichael Menz, MD      . oxyCODONE (Oxy IR/ROXICODONE) immediate release tablet 5-10 mg  5-10 mg Oral Q3H PRN Kennedy BuckerMichael Menz, MD   10 mg at 04/07/16 1211  . zolpidem (AMBIEN) tablet 5 mg  5 mg Oral QHS PRN Kennedy BuckerMichael Menz, MD         Discharge Medications: Please see discharge summary for a list of discharge medications.  Relevant Imaging Results:  Relevant Lab Results:   Additional Information  (SSN: [id number])  Jaleiyah Alas, Darleen CrockerBailey M, LCSW

## 2016-04-07 NOTE — Transfer of Care (Signed)
Immediate Anesthesia Transfer of Care Note  Patient: Belinda HarmanCynthia Christenson  Procedure(s) Performed: Procedure(s): TOTAL KNEE ARTHROPLASTY (Left)  Patient Location: PACU  Anesthesia Type:General  Level of Consciousness: awake  Airway & Oxygen Therapy: Patient Spontanous Breathing and Patient connected to face mask oxygen  Post-op Assessment: Report given to RN and Post -op Vital signs reviewed and stable  Post vital signs: Reviewed and stable  Last Vitals:  Vitals:   04/07/16 0604  BP: (!) 147/85  Pulse: 90  Resp: 14  Temp: 36.5 C    Last Pain:  Vitals:   04/07/16 0604  TempSrc: Oral  PainSc: 0-No pain         Complications: No apparent anesthesia complications

## 2016-04-07 NOTE — Progress Notes (Signed)
Pt arrived from PACU via bed at approx 1120. Pt drowsy, but wakes to voice and is oriented x4, answers questions appropriately. Respirations are shallow, lungs are clear, Hr is regular. Abdomen is soft, bs heard. Pt has foley draining yellow urine, ppp, bilat foot pumps in place, ted hose on R leg. L leg has a dressing on the knee with polar care in place, ace wrap extends from above the knee to the toes. Pt is able to move her toes, rates pain at 5/10 to her knee. PIV #20 intact to R fa with ns hung at 5375mls/hr, site is free of redness and swelling. Since arrival, pt has tolerated clear liquids,, and received oxycodone for pain in anticipation of possible physical therapy this afternoon. Pt is oriented to room and call bell.

## 2016-04-07 NOTE — Anesthesia Procedure Notes (Signed)
Procedure Name: Intubation Date/Time: 04/07/2016 7:20 AM Performed by: Ginger CarneMICHELET, Erskine Steinfeldt Pre-anesthesia Checklist: Patient identified, Emergency Drugs available, Suction available, Patient being monitored and Timeout performed Patient Re-evaluated:Patient Re-evaluated prior to inductionOxygen Delivery Method: Circle system utilized Preoxygenation: Pre-oxygenation with 100% oxygen Intubation Type: IV induction Ventilation: Mask ventilation without difficulty Laryngoscope Size: Miller and 2 Grade View: Grade I Tube type: Oral Tube size: 7.0 mm Number of attempts: 1 Airway Equipment and Method: Stylet Placement Confirmation: ETT inserted through vocal cords under direct vision,  positive ETCO2 and breath sounds checked- equal and bilateral Secured at: 20 cm Tube secured with: Tape Dental Injury: Teeth and Oropharynx as per pre-operative assessment

## 2016-04-07 NOTE — H&P (Signed)
Reviewed paper H+P, will be scanned into chart. No changes noted.  

## 2016-04-07 NOTE — Op Note (Signed)
04/07/2016  9:43 AM  PATIENT:  Belinda Mitchell  60 y.o. female  PRE-OPERATIVE DIAGNOSIS:  PRIMARY OSTEOARTHRITIS LEFT KNEE  POST-OPERATIVE DIAGNOSIS:  PRIMARY OSTEOARTHRITIS LEFT KNEE  PROCEDURE:  Procedure(s): TOTAL KNEE ARTHROPLASTY (Left)  SURGEON: Leitha SchullerMichael J Ruweyda Macknight, MD  ASSISTANTS: none  ANESTHESIA:   general  EBL:  Total I/O In: 1400 [I.V.:1400] Out: 260 [Urine:60; Blood:200]  BLOOD ADMINISTERED:none  DRAINS: none   LOCAL MEDICATIONS USED:  BUPIVICAINE  and OTHER Exparel, morphine, toradol  SPECIMEN:  No Specimen  DISPOSITION OF SPECIMEN:  N/A  COUNTS:  YES  TOURNIQUET:  66 minutes at 300 mmHg  IMPLANTS: Medacta GMK total knee with 4 left femur, 3 tibia with short stem 14 mm PS insert and 2 patella, all components cemented   DICTATION: .Dragon Dictation patient was brought the operating room and after adequate anesthesia was obtained the left leg was prepped and draped in sterile fashion. After patient identification and timeout procedures were completed midline skin incision was made with the knee in flexion followed by medial parapatellar arthrotomy exposing significant erosion of the bone in the lateral compartment mild medial compartment degenerative change and extensive patellofemoral degenerative change with moderate synovitis in the suprapatellar pouch. The fat pad and anterior cruciate ligament were excised along with the synovitis in the suprapatellar pouch. The the distal femur was exposed and a distal femoral cut was carried out using the Medacta my knee cutting guides. Pins were placed and a 4 cutting block was applied anterior posterior chamfer cuts made. Next the proximal tibia was adequately exposed application of the my knee cutting guide because the valgus deformity the cut was raised and less bone was removed from the tibia after resection the posterior horns of the menisci could be removed without difficulty. The IT band was quite tight and this was  partially elevated off the lateral side to help fix the valgus deformity. The 3 tibia template was placed and pinned in position with proximal tibia and keel preparation. Next the 4 femur was placed and a 14 mm insert gave good stability and full extension. Distal femoral drill holes trochlear groove and PS notch were taken care of at this time. At this point the trials were all removed and the patella cut and measured a size 2 after 3 drill holes were made THE 10 out of the knee the tourniquet was let down and hemostasis checked electrocautery. The bony surfaces were then thoroughly irrigated and dried after having done the above injections throughout the periarticular tissue. The tourniquet was again raised and surfaces dried and then the components cemented into place with excess cement removed the knee held in extension as the cement set. Excess cement was then removed and the knee thoroughly irrigated with pulsatile lavage. Because the valgus deformity there was a tight lateral retinaculum and a lateral release was performed with tourniquet down. The arthrotomy was repaired using a heavy Quill. 3-0 V-loc subcutaneous closure with skin staples. Xeroform 4 x 4's ABDs and web roll and Ace wrap applied with Polar Care no knee immobilizer because of the valgus deformity preop somewhat rigid  PLAN OF CARE: Admit to inpatient   PATIENT DISPOSITION:  PACU - hemodynamically stable.

## 2016-04-08 LAB — CBC
HCT: 35.5 % (ref 35.0–47.0)
HEMOGLOBIN: 11.9 g/dL — AB (ref 12.0–16.0)
MCH: 30.1 pg (ref 26.0–34.0)
MCHC: 33.5 g/dL (ref 32.0–36.0)
MCV: 90 fL (ref 80.0–100.0)
Platelets: 330 10*3/uL (ref 150–440)
RBC: 3.94 MIL/uL (ref 3.80–5.20)
RDW: 13.9 % (ref 11.5–14.5)
WBC: 12.7 10*3/uL — ABNORMAL HIGH (ref 3.6–11.0)

## 2016-04-08 LAB — BASIC METABOLIC PANEL
Anion gap: 7 (ref 5–15)
BUN: 22 mg/dL — ABNORMAL HIGH (ref 6–20)
CALCIUM: 8.6 mg/dL — AB (ref 8.9–10.3)
CO2: 27 mmol/L (ref 22–32)
CREATININE: 0.58 mg/dL (ref 0.44–1.00)
Chloride: 102 mmol/L (ref 101–111)
Glucose, Bld: 133 mg/dL — ABNORMAL HIGH (ref 65–99)
Potassium: 3.7 mmol/L (ref 3.5–5.1)
SODIUM: 136 mmol/L (ref 135–145)

## 2016-04-08 NOTE — Progress Notes (Signed)
Physical Therapy Treatment Patient Details Name: Belinda Mitchell MRN: [id number] DOB: Mar 16, 1956 Today's Date: 04/08/2016    History of Present Illness admitted for acute hospitalization status post L TKR, 04/07/16, WBAT.    PT Comments    Patient making excellent progress towards all therapy goals, demonstrating ability to complete gait around nursing station with RW, supervision assist.  Min cuing for L hip extension in loading phase of gait cycle; good integration of cuing. Gait speed slightly below that of age-matched norms (10' walk time, 7 seconds), but certainly functional for household mobility.  Good stability without significant safety concerns noted. Patient with exceptional motivation and participation with all therex/theract; plan to trial stairs this PM. Discharge recommendations updated to reflect progression to outpatient PT.  Patient in agreement and very interested in pursuing.  Care management informed/aware.   Follow Up Recommendations  Outpatient PT     Equipment Recommendations  Rolling walker with 5" wheels    Recommendations for Other Services       Precautions / Restrictions Precautions Precautions: Fall Restrictions Weight Bearing Restrictions: No LLE Weight Bearing: Weight bearing as tolerated    Mobility  Bed Mobility Overal bed mobility: Modified Independent                Transfers Overall transfer level: Needs assistance Equipment used: Rolling walker (2 wheeled) Transfers: Sit to/from Stand Sit to Stand: Supervision;Modified independent (Device/Increase time)            Ambulation/Gait Ambulation/Gait assistance: Supervision Ambulation Distance (Feet): 220 Feet Assistive device: Rolling walker (2 wheeled)   Gait velocity: 10' walk time, 7 seconds   General Gait Details: step to progressing to step through, reciprocal gait with good L LE WBing and control.  Min cuing for L hip extension/overall postural extension in L  stance phase of gait.   Stairs            Wheelchair Mobility    Modified Rankin (Stroke Patients Only)       Balance Overall balance assessment: Needs assistance Sitting-balance support: No upper extremity supported;Feet supported Sitting balance-Leahy Scale: Normal     Standing balance support: Bilateral upper extremity supported Standing balance-Leahy Scale: Good                      Cognition Arousal/Alertness: Awake/alert Behavior During Therapy: WFL for tasks assessed/performed Overall Cognitive Status: Within Functional Limits for tasks assessed                      Exercises Other Exercises Other Exercises: Seated LE therex, 1x15, AROM: ankle pumps, LAQs with hip abduct/adduct (t-exercise), marching, hip adduct.  Excellent quad control/activation in L LE Other Exercises: toilet transfer, ambulatory with Rw, distant sup; sit/stand from standard toilet, distant sup/mod indep; standing balance for hand hygiene at sink, distant sup/mod indep.  Occasional cuing for slower, more controlled movements; slightly impulsive at times.    General Comments        Pertinent Vitals/Pain Pain Assessment: 0-10 Pain Score: 7  Pain Location: L knee Pain Descriptors / Indicators: Aching;Sore Pain Intervention(s): Limited activity within patient's tolerance;Monitored during session;Premedicated before session;Repositioned    Home Living                      Prior Function            PT Goals (current goals can now be found in the care plan section) Acute Rehab PT Goals Patient Stated  Goal: to return home by Friday PT Goal Formulation: With patient Time For Goal Achievement: 04/21/16 Potential to Achieve Goals: Good Progress towards PT goals: Progressing toward goals    Frequency    BID      PT Plan Discharge plan needs to be updated    Co-evaluation             End of Session Equipment Utilized During Treatment: Gait  belt Activity Tolerance: Patient tolerated treatment well Patient left: in chair;with call bell/phone within reach;with chair alarm set     Time: [id number] PT Time Calculation (min) (ACUTE ONLY): 25 min  Charges:  $Gait Training: 8-22 mins $Therapeutic Exercise: 8-22 mins                    G Codes:      Belinda Mitchell, PT, DPT, NCS 04/08/16, 10:56 AM [phone]

## 2016-04-08 NOTE — Care Management Note (Signed)
Case Management Note  Patient Details  Name: Belinda Mitchell MRN: [id number] Date of Birth: [birth year]   Subjective/Objective:   Met with patient. She wants to do OP PT only. CM could only get an appointment at [facility] for next Friday, Jan. 5. At 10:00 am. Patient updated and is agreeable to do [facility] PT until then. Offered choice with referral to [facility]. [facility]acy: [facility], [address] [place]. [phone]. Called Lovenox 40 mg # 14, no refills. She has a walker. PCP is Dr. Illene Labrador.          Action/Plan: HHPT with [facility]. Lovenox called in. No DME needs.   Expected Discharge Date:                  Expected Discharge Plan:  [place]  In-House Referral:     Discharge planning Services  CM Consult  Post Acute Care Choice:  Home Health Choice offered to:  Patient  DME Arranged:    DME Agency:     HH Arranged:  PT [place]:  [facility] [employer]  Status of Service:  In process, will continue to follow  If discussed at Long Length of Stay Meetings, dates discussed:    Additional Comments:  Jolly Mango, RN 04/08/2016, 9:51 AM

## 2016-04-08 NOTE — Progress Notes (Signed)
CH rounding the unit visited the Pt. Pt was alert. [facility] loss of [facility] mother 2 months ago, the loss of her father 7 years ago, a difficult marriage that ended in divorce, other painful experiences in life, including health challenges. Pt told CH that Psalms 23 and a trust in Jesus has helped her cope. Pt was physically emotional and tearful as she talked about her life and then requested for prayers, which the [facility] offered.    04/08/16 1300  Clinical Encounter Type  Visited With Patient  Visit Type Initial  Referral From Nurse  Consult/Referral To Chaplain  Spiritual Encounters  Spiritual Needs Prayer;Other (Comment)

## 2016-04-08 NOTE — Progress Notes (Signed)
Shift assessment completed at 0720. Pt is awake, alert and oriented, in no distress. Pt is on room air, lungs are clear bilat, Hr is regular, abdomen is soft, bs heard. Pt has dressing and polar care intact to l knee, ace wrap extends from above the knee to the toes. Foot pumps on bilat. PIV #20 intact to R fa with iv ns infusing, site is free of redness and swelling. Pt not currently using bone foam. Since assessment, pt has walked the circumference of the nurse's station with walker and Pt, OT consult completed, pt is oob to recliner, received oxycodone and MOM at approx 1015. Pt has walked to the bathroom to void, ivf has been dc'd. Call bell in reach.

## 2016-04-08 NOTE — Evaluation (Signed)
Occupational Therapy Evaluation Patient Details Name: Belinda Mitchell MRN: [id number] DOB: [birth year] Today's Date: 04/08/2016    History of Present Illness Pt. is a 60 y.o. female who was [facility] to [facility] for a Left TKR   Clinical Impression   Pt. Is a 60 y.o. female who was [facility] for a Left T[facility]. Pt. education was provided about A/E use for LE ADLs. Pt. is able to perform LE ADLs without A/E. No further OT services are warranted at this time. Pt. plans to return home upon discharge.    Follow Up Recommendations  No OT follow up    Equipment Recommendations       Recommendations for Other Services       Precautions / Restrictions Precautions Precautions: Fall Restrictions Weight Bearing Restrictions: No LLE Weight Bearing: Weight bearing as tolerated              ADL Overall ADL's : Needs assistance/impaired Eating/Feeding: Set up   Grooming: Set up               Lower Body Dressing: Set up;Supervision/safety                 General ADL Comments: Pt. education was provided about A/E use for LE ADLs.     Vision     Perception     Praxis      Pertinent Vitals/Pain Pain Assessment: 0-10 Pain Score: 6  Pain Location: Left Knn Pain Descriptors / Indicators: Aching;Sore Pain Intervention(s): Limited activity within patient's tolerance;Monitored during session;Repositioned     Hand Dominance Right   Extremity/Trunk Assessment Upper Extremity Assessment Upper Extremity Assessment: Overall WFL for tasks assessed           Communication Communication Communication: No difficulties   Cognition Arousal/Alertness: Awake/alert Behavior During Therapy: WFL for tasks assessed/performed Overall Cognitive Status: Within Functional Limits for tasks assessed                     General Comments       Exercises      Shoulder Instructions      Home Living Family/patient expects to be discharged to:: Private  residence Living Arrangements: Alone (Son lives in a house on her property) Available Help at Discharge: Family Type of Home: House Home Access: Stairs to enter Secretary/administratorntrance Stairs-Number of Steps: 3 Entrance Stairs-Rails: None Home Layout: One level     Bathroom Shower/Tub: Tub/shower unit Shower/tub characteristics: Engineer, building servicesCurtain Bathroom Toilet: Standard Bathroom Accessibility: Yes              Prior Functioning/Environment Level of Independence: Independent                 OT Problem List:     OT Treatment/Interventions:      OT Goals(Current goals can be found in the care plan section) Acute Rehab OT Goals Patient Stated Goal: To return home, and get back to exercising. OT Goal Formulation: With patient Potential to Achieve Goals: Good  OT Frequency:     Barriers to D/C:            Co-evaluation              End of Session    Activity Tolerance: Patient tolerated treatment well Patient left: in chair;with call bell/phone within reach;with chair alarm set   Time: [id number] OT Time Calculation (min): 30 min Charges:  OT General Charges $OT Visit: 1 Procedure OT Evaluation $OT Eval Moderate Complexity: 1 Procedure  G-Codes:    Belinda MessierElaine Latronda Spink, MS, OTR/L 04/08/2016, 11:05 AM

## 2016-04-08 NOTE — Plan of Care (Signed)
Problem: Bowel/Gastric: Goal: Will not experience complications related to bowel motility Outcome: Progressing Pt is progressing well toward discharge.

## 2016-04-08 NOTE — Progress Notes (Signed)
   Subjective: 1 Day Post-Op Procedure(s) (LRB): TOTAL KNEE ARTHROPLASTY (Left) Patient reports pain as 5 on 0-10 scale.   Patient is well, and has had no acute complaints or problems We will start therapy today.  Plan is to go Home after hospital stay. no nausea and no vomiting Patient denies any chest pains or shortness of breath. Patient states she is ready to go home today.  Objective: Vital signs in last 24 hours: Temp:  [97.6 F (36.4 C)-98.3 F (36.8 C)] 98 F (36.7 C) (12/27 0344) Pulse Rate:  [73-81] 79 (12/27 0344) Resp:  [10-20] 18 (12/27 0344) BP: (95-137)/(55-85) 137/85 (12/27 0344) SpO2:  [93 %-99 %] 97 % (12/27 0344) FiO2 (%):  [32 %] 32 % (12/26 1132) Heels are non tender and elevated off the bed using rolled towels along with bone foam  Intake/Output from previous day: 12/26 0701 - 12/27 0700 In: 1740 [P.O.:240; I.V.:1500] Out: 2785 [Urine:2585; Blood:200] Intake/Output this shift: Total I/O In: 240 [P.O.:240] Out: 2525 [Urine:2525]   Recent Labs  04/07/16 1148 04/08/16 0512  HGB 13.6 11.9*    Recent Labs  04/07/16 1148 04/08/16 0512  WBC 14.3* 12.7*  RBC 4.39 3.94  HCT 39.2 35.5  PLT 334 330    Recent Labs  04/07/16 1148 04/08/16 0512  NA  --  136  K  --  3.7  CL  --  102  CO2  --  27  BUN  --  22*  CREATININE 0.79 0.58  GLUCOSE  --  133*  CALCIUM  --  8.6*   No results for input(s): LABPT, INR in the last 72 hours.  EXAM General - Patient is Alert, Appropriate and Oriented Extremity - Neurologically intact Neurovascular intact Sensation intact distally Intact pulses distally Dorsiflexion/Plantar flexion intact Compartment soft Dressing - dressing C/D/I Motor Function - intact, moving foot and toes well on exam. Able to do straight leg raise on her own.  Past Medical History:  Diagnosis Date  . Arthritis    back, hips, knees  . Degenerative disc disease, lumbar    L5  . Depression 2017   situational death of  parents  . Pneumonia 2012    Assessment/Plan: 1 Day Post-Op Procedure(s) (LRB): TOTAL KNEE ARTHROPLASTY (Left) Active Problems:   Primary osteoarthritis of left knee  Estimated body mass index is 28.29 kg/m as calculated from the following:   Height as of 03/25/16: 5\' 5"  (1.651 m).   Weight as of 03/25/16: 77.1 kg (170 lb). Advance diet Up with therapy D/C IV fluids Plan for discharge tomorrow Discharge home with home health  Labs: Were reviewed DVT Prophylaxis - Lovenox, Foot Pumps and TED hose Weight-Bearing as tolerated to left leg D/C O2 and Pulse OX and try on Room Air Begin working on bowel movement  [place] Diana R. [facility] LPWolfe PA [facility] 04/08/2016, 6:45 AM

## 2016-04-08 NOTE — Progress Notes (Addendum)
Physical Therapy Treatment Patient Details Name: Belinda HarmanCynthia Mitchell MRN: [id number] DOB: May 20, 1955 Today's Date: 04/08/2016    History of Present Illness admitted for acute hospitalization status post L TKR, 04/07/16, WBAT.    PT Comments    Patient continues to demonstrate excellent progress towards all goals, meeting both transfer and stair training goal this PM.    Patient very comfortable with mobility and demonstrates functional ability to safely return home at this time.  Additional activity deferred this PM, as patient wishing to attempt BM with toileting.  Will continue efforts/progression next session as appropriate.   Follow Up Recommendations  Outpatient PT     Equipment Recommendations  Rolling walker with 5" wheels    Recommendations for Other Services       Precautions / Restrictions Precautions Precautions: Fall Restrictions Weight Bearing Restrictions: Yes LLE Weight Bearing: Weight bearing as tolerated    Mobility  Bed Mobility Overal bed mobility: Modified Independent             General bed mobility comments: self-assist to L LE as needed   Transfers Overall transfer level: Modified independent Equipment used: Rolling walker (2 wheeled) Transfers: Sit to/from Stand           General transfer comment: L LE slightly anterior to BOS with all movement transitions  Ambulation/Gait Ambulation/Gait assistance: Supervision Ambulation Distance (Feet): 220 Feet Assistive device: Rolling walker (2 wheeled)       General Gait Details: decreased L TKE in heel strike and loading phases of gait (limited by edema); continued cuing for postural and L hip extension.  Good cadence and gait speed.   Stairs Stairs: Yes   Stair Management: Two rails;One rail Right Number of Stairs: 4 General stair comments: first trial with bilat rails, second trial with bilat UEs on single rail (to simulate home environment).  Min cuing for technique, but good  carry-over with training  Wheelchair Mobility    Modified Rankin (Stroke Patients Only)       Balance                                    Cognition Arousal/Alertness: Awake/alert Behavior During Therapy: WFL for tasks assessed/performed Overall Cognitive Status: Within Functional Limits for tasks assessed                      Exercises Total Joint Exercises Goniometric ROM: 5-95 degrees, act assist Other Exercises Other Exercises: Toilet transfer, ambulatory with RW, mod indep    General Comments        Pertinent Vitals/Pain Pain Score: 6  Pain Location: L knee Pain Descriptors / Indicators: Aching;Sore Pain Intervention(s): Limited activity within patient's tolerance;Monitored during session;Premedicated before session;Repositioned    Home Living                      Prior Function            PT Goals (current goals can now be found in the care plan section) Acute Rehab PT Goals Patient Stated Goal: To return home, and get back to exercising. PT Goal Formulation: With patient Time For Goal Achievement: 04/21/16 Potential to Achieve Goals: Good Progress towards PT goals: Progressing toward goals    Frequency    BID      PT Plan Current plan remains appropriate    Co-evaluation  End of Session Equipment Utilized During Treatment: Gait belt Activity Tolerance: Patient tolerated treatment well Patient left:  (in bathroom for toileting needs; to request assist from nursing when complete.  RN informed/aware of patient position.)     Time: [id number] PT Time Calculation (min) (ACUTE ONLY): 10 min  Charges:                       G Codes:       Martyna Thorns H. Manson PasseyBrown, PT, DPT, NCS 04/08/16, 10:28 PM [phone]

## 2016-04-08 NOTE — Discharge Summary (Signed)
Physician Discharge Summary  Patient ID: Belinda Mitchell MRN: [id number] DOB/AGE: Apr 23, 1955 60 y.o.  Admit date: 04/07/2016 Discharge date: 04/09/2016  Admission Diagnoses:  PRIMARY OSTEOARTHRITIS LEFT KNEE   Discharge Diagnoses: Patient Active Problem List   Diagnosis Date Noted  . Primary osteoarthritis of left knee 04/07/2016    Past Medical History:  Diagnosis Date  . Arthritis    back, hips, knees  . Degenerative disc disease, lumbar    L5  . Depression 2017   situational death of parents  . Pneumonia 2012     Transfusion: No transfusions given doing this admission   Consultants (if any):  case management for home health assistance  Discharged Condition: Improved  Hospital Course: Belinda HarmanCynthia Mitchell is an 60 y.o. female who was admitted 04/07/2016 with a diagnosis of degenerative arthrosis left knee and went to the operating room on 04/07/2016 and underwent the above named procedures.    Surgeries:Procedure(s): TOTAL KNEE ARTHROPLASTY on 04/07/2016  PRE-OPERATIVE DIAGNOSIS:  PRIMARY OSTEOARTHRITIS LEFT KNEE  POST-OPERATIVE DIAGNOSIS:  PRIMARY OSTEOARTHRITIS LEFT KNEE  PROCEDURE:  Procedure(s): TOTAL KNEE ARTHROPLASTY (Left)  SURGEON: Leitha SchullerMichael J Menz, MD  ASSISTANTS: none  ANESTHESIA:   general  EBL:  Total I/O In: 1400 [I.V.:1400] Out: 260 [Urine:60; Blood:200]  BLOOD ADMINISTERED:none  DRAINS: none   LOCAL MEDICATIONS USED:  BUPIVICAINE  and OTHER Exparel, morphine, toradol  SPECIMEN:  No Specimen  DISPOSITION OF SPECIMEN:  N/A  COUNTS:  YES  TOURNIQUET:  66 minutes at 300 mmHg  IMPLANTS: Medacta GMK total knee with 4 left femur, 3 tibia with short stem 14 mm PS insert and 2 patella, all components cemented   Patient tolerated the surgery well. No complications .Patient was taken to PACU where she was stabilized and then transferred to the orthopedic floor.  Patient started on Lovenox 30 mg q 12 hrs. Foot pumps applied  bilaterally at 80 mm hgb. Heels elevated off bed with rolled towels. No evidence of DVT. Calves non tender. Negative Homan. Physical therapy started on day #1 for gait training and transfer with OT starting on  day #1 for ADL and assisted devices. Patient has done well with therapy. Ambulated greater than 200 feet upon being discharged. Was able to go up and down 4 steps safely and independently  Patient's IV and Foley were discontinued on day #1 with the Hemovac being discontinued on day #2 along with dressing change.   She was given perioperative antibiotics:  Anti-infectives    Start     Dose/Rate Route Frequency Ordered Stop   04/07/16 1400  clindamycin (CLEOCIN) IVPB 900 mg     900 mg 100 mL/hr over 30 Minutes Intravenous Every 6 hours 04/07/16 1131 04/08/16 1359   04/06/16 2215  ceFAZolin (ANCEF) IVPB 2g/100 mL premix  Status:  Discontinued     2 g 200 mL/hr over 30 Minutes Intravenous  Once 04/06/16 2214 04/07/16 1131    .  She was fitted with AV 1 compression foot pump devices, instructed on heel pumps, early ambulation, and TED stockings bilaterally for DVT prophylaxis.  She benefited maximally from the hospital stay and there were no complications.    Recent vital signs:  Vitals:   04/07/16 2345 04/08/16 0344  BP: 134/82 137/85  Pulse: 76 79  Resp: 18 18  Temp: 97.7 F (36.5 C) 98 F (36.7 C)    Recent laboratory studies:  Lab Results  Component Value Date   HGB 11.9 (L) 04/08/2016   HGB 13.6 04/07/2016   HGB  15.1 03/25/2016   Lab Results  Component Value Date   WBC 12.7 (H) 04/08/2016   PLT 330 04/08/2016   Lab Results  Component Value Date   INR 0.83 03/25/2016   Lab Results  Component Value Date   NA 136 04/08/2016   K 3.7 04/08/2016   CL 102 04/08/2016   CO2 27 04/08/2016   BUN 22 (H) 04/08/2016   CREATININE 0.58 04/08/2016   GLUCOSE 133 (H) 04/08/2016    Discharge Medications:   Allergies as of 04/09/2016   No Known Allergies      Medication List    TAKE these medications   cetirizine 10 MG tablet Commonly known as:  ZYRTEC Take 10 mg by mouth daily.   cyclobenzaprine 10 MG tablet Commonly known as:  FLEXERIL Take 10 mg by mouth at bedtime as needed for muscle spasms.   enoxaparin 40 MG/0.4ML injection Commonly known as:  LOVENOX Inject 0.4 mLs (40 mg total) into the skin daily.   hydrochlorothiazide 25 MG tablet Commonly known as:  HYDRODIURIL Take 25 mg by mouth daily as needed (for fluid).   oxyCODONE 5 MG immediate release tablet Commonly known as:  Oxy IR/ROXICODONE Take 1-2 tablets (5-10 mg total) by mouth every 3 (three) hours as needed for breakthrough pain.            Durable Medical Equipment        Start     Ordered   04/07/16 1132  DME 3 n 1  Once     04/07/16 1131   04/07/16 1132  DME Bedside commode  Once    Question:  Patient needs a bedside commode to treat with the following condition  Answer:  Status post left knee replacement   04/07/16 1131   04/07/16 1132  DME Walker rolling  Once    Question:  Patient needs a walker to treat with the following condition  Answer:  Status post total knee replacement using cement, left   04/07/16 1131      Diagnostic Studies: Dg Knee 1-2 Views Left  Result Date: 04/07/2016 CLINICAL DATA:  Status post left knee replacement EXAM: LEFT KNEE - 1-2 VIEW COMPARISON:  None. FINDINGS: Left knee replacement is noted. Air is noted within the joint related to the recent surgery. No acute bony abnormality is seen. IMPRESSION: Status post left knee replacement Electronically Signed   By: Alcide CleverMark  Lukens M.D.   On: 04/07/2016 10:34   Ct Knee Left Wo Contrast  Result Date: 03/09/2016 CLINICAL DATA:  Preop for total knee arthroplasty. MY KNEE protocol. EXAM: CT OF THE LEFT KNEE WITHOUT CONTRAST TECHNIQUE: Multidetector CT imaging of the LEFT knee was performed according to the standard protocol. Multiplanar CT image reconstructions were also generated.  COMPARISON:  None. FINDINGS: Left hip:  Mild degenerative changes.  No fracture or AVN. Left knee: Advanced tricompartmental degenerative changes with joint space narrowing, osteophytic spurring, bony eburnation and subchondral cystic change. This is slightly worse in the lateral compartment. No acute fracture or osteochondral lesion. Small joint effusion. Left ankle: Minimal degenerative changes. No fracture or osteochondral lesion. IMPRESSION: Advanced tricompartmental degenerative changes involving the left knee. Electronically Signed   By: Rudie MeyerP.  Gallerani M.D.   On: 03/09/2016 16:57    Disposition: 01-Home or Self Care       Signed: WOLFE,JON R. 04/08/2016, 6:59 AM

## 2016-04-08 NOTE — Progress Notes (Signed)
Pt requested and received suppository.

## 2016-04-08 NOTE — Discharge Instructions (Signed)

## 2016-04-08 NOTE — Progress Notes (Signed)
Clinical Child psychotherapistocial Worker (CSW) received SNF consult. PT is recommending home health/outpatient PT. RN case manager aware of above. Please reconsult if future social work needs arise. CSW signing off.  Baker Hughes IncorporatedBailey Mannie Wineland, LCSW [phone]

## 2016-04-09 MED ORDER — ENOXAPARIN SODIUM 40 MG/0.4ML ~~LOC~~ SOLN: 40.0000 mg | SUBCUTANEOUS | 0 refills | Status: DC

## 2016-04-09 MED ORDER — OXYCODONE HCL 5 MG PO TABS: 5.0000 mg | ORAL_TABLET | ORAL | 0 refills | Status: DC | PRN

## 2016-04-09 NOTE — Progress Notes (Signed)
Maeola Harmanynthia Cogbill to be D/C'd Home per MD order.  Discussed with the patient and all questions fully answered.  VSS, Skin clean, dry and intact without evidence of skin break down, no evidence of skin tears noted. IV catheter discontinued intact. Site without signs and symptoms of complications. Dressing and pressure applied.  An After Visit Summary was printed and given to the patient. Patient received prescription.  D/c education completed with patient/family including follow up instructions, medication list, d/c activities limitations if indicated, with other d/c instructions as indicated by MD - patient able to verbalize understanding, all questions fully answered.   Patient instructed to return to ED, call 911, or call MD for any changes in condition.   Patient escorted via WC, and D/C home via private auto.  Shawna OrleansMelanie Nethaniel Mattie 04/09/2016 12:00 PM

## 2016-04-09 NOTE — Progress Notes (Signed)
   Subjective: 2 Days Post-Op Procedure(s) (LRB): TOTAL KNEE ARTHROPLASTY (Left) Patient reports pain as mild.   Patient is well, and has had no acute complaints or problems Continue with physical therapy therapy today.  Plan is to go Home after hospital stay. no nausea and no vomiting Patient denies any chest pains or shortness of breath. Objective: Vital signs in last 24 hours: Temp:  [97.6 F (36.4 C)-98.6 F (37 C)] 97.6 F (36.4 C) (12/28 0441) Pulse Rate:  [87-93] 93 (12/28 0441) Resp:  [16-18] 18 (12/28 0441) BP: (132-147)/(70-82) 141/79 (12/28 0441) SpO2:  [95 %-96 %] 96 % (12/28 0441) Weight:  [77.1 kg (170 lb)] 77.1 kg (170 lb) (12/27 1938) well approximated incision Heels are non tender and elevated off the bed using rolled towels Intake/Output from previous day: 12/27 0701 - 12/28 0700 In: 945 [P.O.:720; I.V.:225] Out: -  Intake/Output this shift: No intake/output data recorded.   Recent Labs  04/07/16 1148 04/08/16 0512  HGB 13.6 11.9*    Recent Labs  04/07/16 1148 04/08/16 0512  WBC 14.3* 12.7*  RBC 4.39 3.94  HCT 39.2 35.5  PLT 334 330    Recent Labs  04/07/16 1148 04/08/16 0512  NA  --  136  K  --  3.7  CL  --  102  CO2  --  27  BUN  --  22*  CREATININE 0.79 0.58  GLUCOSE  --  133*  CALCIUM  --  8.6*   No results for input(s): LABPT, INR in the last 72 hours.  EXAM General - Patient is Alert, Appropriate and Oriented Extremity - Neurologically intact Neurovascular intact Sensation intact distally Intact pulses distally Dorsiflexion/Plantar flexion intact No cellulitis present Compartment soft Dressing - moderate drainage Motor Function - intact, moving foot and toes well on exam.    Past Medical History:  Diagnosis Date  . Arthritis    back, hips, knees  . Degenerative disc disease, lumbar    L5  . Depression 2017   situational death of parents  . Pneumonia 2012    Assessment/Plan: 2 Days Post-Op Procedure(s)  (LRB): TOTAL KNEE ARTHROPLASTY (Left) Active Problems:   Primary osteoarthritis of left knee  Estimated body mass index is 28.29 kg/m as calculated from the following:   Height as of this encounter: 5\' 5"  (1.651 m).   Weight as of this encounter: 77.1 kg (170 lb). Up with therapy Discharge home with home health  Labs: None DVT Prophylaxis - Lovenox, Foot Pumps and TED hose Weight-Bearing as tolerated to left leg Dressing changed on today's visit Please see the patient 2 extra honeycomb dressings to take home  Jon R. [facility] District[facility]fe PA [facility] 04/09/2016, 7:12 AM

## 2016-04-09 NOTE — Care Management Note (Signed)
Case Management Note  Patient Details  Name: Belinda Mitchell MRN: [id number] Date of Birth: [birth year]  Subjective/Objective:   Patient has a $0 copay for Lovenox. Discharging today.                 Action/Plan: [facility] notified of discharge.   Expected Discharge Date:     04/09/2016             Expected Discharge Plan:  Home w Home Health Services  In-House Referral:     Discharge planning Services  CM Consult  Post Acute Care Choice:  Home Health Choice offered to:  Patient  DME Arranged:    DME Agency:     HH Arranged:  PT HH Agency:  [facility] ([facility]  Status of Service:  Completed, signed off  If discussed at [employer] Meetings, dates discussed:    Additional Comments:  Marily MemosLisa M Jeryl Umholtz, RN 04/09/2016, 9:33 AM

## 2016-04-09 NOTE — Progress Notes (Signed)
Physical Therapy Treatment Patient Details Name: Belinda HarmanCynthia Helinski MRN: [id number] DOB: 01/17/1956 Today's Date: 04/09/2016    History of Present Illness admitted for acute hospitalization status post L TKR, 04/07/16, WBAT.    PT Comments    Pt initially very upset, frustrated and tearful over care she has received on floor. Pt voices all of her concerns/frustrations. Therapist listened and assisted with things able to be rectified at this time. Pt able to participate with therapy afterwards although, pt notes increased pain and limitation today. Education provided on moderation of exercise/activity versus overdoing. Pt states understanding. Left knee range of motion decreased in flexion this session, but remains in good ranges for 2 days post op. Educated on proper stretching carryover at home. Pt ambulating well without loss of balance and good safety awareness. Pt was educated on smaller, more equal reciprocal pattern for decreased stress to Left lower extremity and improved fluid pattern demonstrating good understanding. It is noted that pt only has a standard walker at home; spoke with CM regarding ordering a rolling walker prior to discharge. Pt has current discharge orders home with outpatient PT.   Follow Up Recommendations  Outpatient PT     Equipment Recommendations  Rolling walker with 5" wheels (Spoke with CM, as pt only has sw)    Recommendations for Other Services       Precautions / Restrictions Precautions Precautions: Fall Restrictions Weight Bearing Restrictions: Yes LLE Weight Bearing: Weight bearing as tolerated    Mobility  Bed Mobility Overal bed mobility: Modified Independent             General bed mobility comments: Instructed in hook technique for assisting LLE in/out of bed  Transfers Overall transfer level: Modified independent Equipment used: Rolling walker (2 wheeled) Transfers: Sit to/from Stand Sit to Stand: Modified independent  (Device/Increase time)         General transfer comment: Good use of hands; instructed in increased use of LLE for stretch/strengthening oppotunity functionally.  Ambulation/Gait Ambulation/Gait assistance: Supervision Ambulation Distance (Feet): 120 Feet Assistive device: Rolling walker (2 wheeled) Gait Pattern/deviations: Step-through pattern;Decreased step length - right;Decreased stance time - left (decreased knee extension L with stance phase) Gait velocity: reduced Gait velocity interpretation:  (1.4 ft/sec) General Gait Details: Encouraged stand weight shift with QS before ambulating. Encouraged decreased step length L and increased R for more equal reciprocal pattern and decreased stress to LLE. Pt with good correction. Decreased knee extension on L with stance phase   Stairs            Wheelchair Mobility    Modified Rankin (Stroke Patients Only)       Balance Overall balance assessment: Modified Independent                                  Cognition Arousal/Alertness: Awake/alert Behavior During Therapy: Agitated;WFL for tasks assessed/performed (frustrated, tearful) Overall Cognitive Status: Within Functional Limits for tasks assessed                 General Comments: Pt very upset with level of care she has received during her stay. Pt expresses all of her frustrations and concerns. Therapist listened, offered apologies and remedied situations I could assist pt with currently    Exercises Total Joint Exercises Quad Sets: Strengthening;Both;10 reps;Standing Long Arc Quad: Left;AAROM;10 reps;Seated (2 sets) Knee Flexion: AROM;Left;10 reps;Seated (2-3 positions each rep with 10 sec hold each) Goniometric ROM: -5  to 85 (More sore today; encouraged Mod discomfort, more frequency) Marching in Standing: Strengthening;AROM;Left;10 reps Other Exercises Other Exercises: Education on carry over at home with stretching both flex/ext for tech,  reps, duration and freq. Education on use of CP. Education on open versus close knee position and swelling management    General Comments        Pertinent Vitals/Pain Pain Assessment: 0-10 Pain Score: 6  Pain Location: L knee/thigh Pain Descriptors / Indicators: Aching;Sore;Operative site guarding    Home Living                      Prior Function            PT Goals (current goals can now be found in the care plan section) Progress towards PT goals: Progressing toward goals    Frequency    BID      PT Plan Current plan remains appropriate    Co-evaluation             End of Session   Activity Tolerance: Patient tolerated treatment well Patient left: in bed;with call bell/phone within reach;with family/visitor present     Time: [id number] PT Time Calculation (min) (ACUTE ONLY): 50 min  Charges:  $Gait Training: 8-22 mins $Therapeutic Exercise: 23-37 mins                    G CodesScot Dock:      Henessy Rohrer E Naiomy Watters, PTA 04/09/2016, 12:13 PM

## 2016-04-09 NOTE — Care Management (Signed)
Patient requesting rolling walker. Ordered from [facility].

## 2016-04-21 NOTE — Anesthesia Postprocedure Evaluation (Signed)
Anesthesia Post Note  Patient: Belinda Mitchell  Procedure(s) Performed: Procedure(s) (LRB): TOTAL KNEE ARTHROPLASTY (Left)  Patient location during evaluation: PACU Anesthesia Type: General Level of consciousness: awake and alert Pain management: pain level controlled Vital Signs Assessment: post-procedure vital signs reviewed and stable Respiratory status: spontaneous breathing, nonlabored ventilation, respiratory function stable and patient connected to nasal cannula oxygen Cardiovascular status: blood pressure returned to baseline and stable Postop Assessment: no signs of nausea or vomiting Anesthetic complications: no     Last Vitals:  Vitals:   04/09/16 0720 04/09/16 0944  BP: 138/81   Pulse: 97 (!) 104  Resp: 18   Temp: 36.7 C     Last Pain:  Vitals:   04/09/16 1042  TempSrc:   PainSc: 8                  Belinda Mitchell

## 2018-01-14 ENCOUNTER — Other Ambulatory Visit: Payer: Self-pay | Admitting: Family Medicine

## 2018-01-14 DIAGNOSIS — Z1231 Encounter for screening mammogram for malignant neoplasm of breast: Secondary | ICD-10-CM

## 2018-01-19 ENCOUNTER — Other Ambulatory Visit: Payer: Self-pay | Admitting: Orthopedic Surgery

## 2018-01-19 DIAGNOSIS — M25561 Pain in right knee: Secondary | ICD-10-CM

## 2018-01-20 ENCOUNTER — Ambulatory Visit
Admission: RE | Admit: 2018-01-20 | Discharge: 2018-01-20 | Disposition: A | Discharge status: Home / Self Care | Payer: BLUE CROSS/BLUE SHIELD | Source: Ambulatory Visit | Attending: Orthopedic Surgery | Admitting: Orthopedic Surgery

## 2018-01-20 DIAGNOSIS — M2391 Unspecified internal derangement of right knee: Secondary | ICD-10-CM | POA: Insufficient documentation

## 2018-01-20 DIAGNOSIS — M1611 Unilateral primary osteoarthritis, right hip: Secondary | ICD-10-CM | POA: Insufficient documentation

## 2018-01-20 DIAGNOSIS — M25561 Pain in right knee: Secondary | ICD-10-CM | POA: Insufficient documentation

## 2018-02-03 ENCOUNTER — Encounter
Admission: RE | Admit: 2018-02-03 | Discharge: 2018-02-03 | Disposition: A | Discharge status: Home / Self Care | Payer: BLUE CROSS/BLUE SHIELD | Source: Ambulatory Visit | Attending: Orthopedic Surgery | Admitting: Orthopedic Surgery

## 2018-02-03 ENCOUNTER — Other Ambulatory Visit: Payer: Self-pay

## 2018-02-03 DIAGNOSIS — Z01812 Encounter for preprocedural laboratory examination: Secondary | ICD-10-CM | POA: Diagnosis not present

## 2018-02-03 LAB — URINALYSIS, ROUTINE W REFLEX MICROSCOPIC
BACTERIA UA: NONE SEEN
BILIRUBIN URINE: NEGATIVE
Glucose, UA: NEGATIVE mg/dL
KETONES UR: NEGATIVE mg/dL
LEUKOCYTES UA: NEGATIVE
NITRITE: NEGATIVE
PROTEIN: NEGATIVE mg/dL
Specific Gravity, Urine: 1.008 (ref 1.005–1.030)
Squamous Epithelial / LPF: NONE SEEN (ref 0–5)
pH: 6 (ref 5.0–8.0)

## 2018-02-03 LAB — SURGICAL PCR SCREEN
MRSA, PCR: NEGATIVE
STAPHYLOCOCCUS AUREUS: NEGATIVE

## 2018-02-03 LAB — BASIC METABOLIC PANEL
ANION GAP: 9 (ref 5–15)
BUN: 20 mg/dL (ref 8–23)
CO2: 30 mmol/L (ref 22–32)
CREATININE: 0.62 mg/dL (ref 0.44–1.00)
Calcium: 9.8 mg/dL (ref 8.9–10.3)
Chloride: 97 mmol/L — ABNORMAL LOW (ref 98–111)
GFR calc non Af Amer: >60 mL/min (ref >60)
Glucose, Bld: 95 mg/dL (ref 70–99)
POTASSIUM: 3.6 mmol/L (ref 3.5–5.1)
Sodium: 136 mmol/L (ref 135–145)

## 2018-02-03 LAB — CBC
HCT: 47.6 % — ABNORMAL HIGH (ref 36.0–46.0)
Hemoglobin: 15.5 g/dL — ABNORMAL HIGH (ref 12.0–15.0)
MCH: 29.6 pg (ref 26.0–34.0)
MCHC: 32.6 g/dL (ref 30.0–36.0)
MCV: 90.8 fL (ref 80.0–100.0)
NRBC: 0 % (ref 0.0–0.2)
PLATELETS: 434 10*3/uL — AB (ref 150–400)
RBC: 5.24 MIL/uL — ABNORMAL HIGH (ref 3.87–5.11)
RDW: 13.1 % (ref 11.5–15.5)
WBC: 8.3 10*3/uL (ref 4.0–10.5)

## 2018-02-03 LAB — PROTIME-INR
INR: 0.87
PROTHROMBIN TIME: 11.8 s (ref 11.4–15.2)

## 2018-02-03 LAB — TYPE AND SCREEN
ABO/RH(D): B NEG
Antibody Screen: NEGATIVE

## 2018-02-03 LAB — APTT: APTT: 33 s (ref 24–36)

## 2018-02-03 LAB — SEDIMENTATION RATE: Sed Rate: 2 mm/hr (ref 0–30)

## 2018-02-03 NOTE — Pre-Procedure Instructions (Signed)
CBC, UA results faxed to Dr. Neomia Glass office.

## 2018-02-03 NOTE — Patient Instructions (Signed)
  Your procedure is scheduled on: Tuesday February 15, 2018 Report to Same Day Surgery 2nd floor [facility] Entrance-take elevator on left to 2nd floor.  Check in with surgery information desk.) To find out your arrival time, call [phone] 1:00-3:00 PM on Monday February 14, 2018  Remember: Instructions that are not followed completely may result in serious medical risk, up to and including death, or upon the discretion of your surgeon and anesthesiologist your surgery may need to be rescheduled.    __x__ 1. Do not eat food (including mints, candies, chewing gum) after midnight the night before your procedure. You may drink clear liquids up to 2 hours before you are scheduled to arrive at the hospital for your procedure.  Do not drink anything within 2 hours of your scheduled arrival to the hospital.  Approved clear liquids:  --Water or Apple juice without pulp  --Clear carbohydrate beverage such as Gatorade or Powerade  --Black Coffee or Clear Tea (No milk, no creamers, do not add anything to the coffee or tea)    __x__ 2. No Alcohol for 24 hours before or after surgery.   __x__ 3. No Smoking or e-cigarettes for 24 hours before surgery.  Do not use any chewable tobacco products for at least 6 hours before surgery.   __x__ 4. Notify your doctor if there is any change in your medical condition (cold, fever, infections).   __x__ 5. On the morning of surgery brush your teeth with toothpaste and water.  You may rinse your mouth with mouthwash if you wish.  Do not swallow any toothpaste or mouthwash.  Please read over the following fact sheets that you were given:   [facility] Preparing for Surgery and/or MRSA Information    __x__ Use CHG Soap or Sage wipes as directed on instruction sheet   Do not wear jewelry, make-up, hairpins, clips or nail polish on the day of surgery.  Do not wear lotions, powders, deodorant, or perfumes.   Do not shave below the face/neck 48 hours  prior to surgery.   Do not bring valuables to the hospital.    [facility] is not responsible for any belongings or valuables.               Contacts, dentures or bridgework may not be worn into surgery.  Leave your suitcase in the car. After surgery it may be brought to your room.  For patients admitted to the hospital, discharge time is determined by your treatment team.  __x__ Take anti-hypertensive listed below, cardiac, seizure, asthma, anti-reflux and psychiatric medicines. These include:  1. None on the day of surgery  __x__ Follow recommendations from Cardiologist, Pulmonologist or PCP regarding stopping Aspirin, Coumadin, Plavix, Eliquis, Effient, Pradaxa, and Pletal.  __x__ TODAY: Stop Anti-inflammatories such as Advil, Ibuprofen, Motrin, Aleve, Naproxen, Naprosyn, BC/Goodies powders or aspirin products. You may continue to take Tylenol and Celebrex.   __x__ TODAY: Stop supplements until after surgery. You may continue to take Vitamin D, Vitamin B, and multivitamin.

## 2018-02-04 LAB — URINE CULTURE: CULTURE: NO GROWTH

## 2018-02-07 ENCOUNTER — Ambulatory Visit
Admission: RE | Admit: 2018-02-07 | Discharge: 2018-02-07 | Disposition: A | Discharge status: Home / Self Care | Payer: BLUE CROSS/BLUE SHIELD | Source: Ambulatory Visit | Attending: Family Medicine | Admitting: Family Medicine

## 2018-02-07 DIAGNOSIS — Z1231 Encounter for screening mammogram for malignant neoplasm of breast: Secondary | ICD-10-CM

## 2018-02-14 MED ORDER — CEFAZOLIN SODIUM-DEXTROSE 2-4 GM/100ML-% IV SOLN
2.0000 g | Freq: Once | INTRAVENOUS | Status: AC
  Administered 2018-02-15: 2 g via INTRAVENOUS

## 2018-02-14 MED ORDER — TRANEXAMIC ACID-NACL 1000-0.7 MG/100ML-% IV SOLN
1000.0000 mg | INTRAVENOUS | Status: AC
  Administered 2018-02-15: 1000 mg via INTRAVENOUS
  Filled 2018-02-14 (×2): qty 100

## 2018-02-15 ENCOUNTER — Inpatient Hospital Stay: Payer: BLUE CROSS/BLUE SHIELD

## 2018-02-15 ENCOUNTER — Inpatient Hospital Stay
Admission: RE | Admit: 2018-02-15 | Discharge: 2018-02-17 | DRG: 470 | Disposition: A | Discharge status: Home Health | Payer: BLUE CROSS/BLUE SHIELD | Source: Ambulatory Visit | Attending: Orthopedic Surgery | Admitting: Orthopedic Surgery

## 2018-02-15 ENCOUNTER — Inpatient Hospital Stay: Payer: BLUE CROSS/BLUE SHIELD | Admitting: Anesthesiology

## 2018-02-15 ENCOUNTER — Other Ambulatory Visit: Payer: Self-pay

## 2018-02-15 ENCOUNTER — Encounter
Admission: RE | Disposition: A | Discharge status: Home Health | Payer: Self-pay | Source: Ambulatory Visit | Attending: Orthopedic Surgery

## 2018-02-15 DIAGNOSIS — G8918 Other acute postprocedural pain: Secondary | ICD-10-CM

## 2018-02-15 DIAGNOSIS — Z96652 Presence of left artificial knee joint: Secondary | ICD-10-CM | POA: Diagnosis present

## 2018-02-15 DIAGNOSIS — Z96651 Presence of right artificial knee joint: Secondary | ICD-10-CM

## 2018-02-15 DIAGNOSIS — Z87891 Personal history of nicotine dependence: Secondary | ICD-10-CM

## 2018-02-15 DIAGNOSIS — M1711 Unilateral primary osteoarthritis, right knee: Secondary | ICD-10-CM | POA: Diagnosis present

## 2018-02-15 HISTORY — PX: TOTAL KNEE ARTHROPLASTY: SHX125

## 2018-02-15 SURGERY — ARTHROPLASTY, KNEE, TOTAL
Anesthesia: General | Site: Knee | Laterality: Right

## 2018-02-15 MED ORDER — CYCLOBENZAPRINE HCL 10 MG PO TABS
10.0000 mg | ORAL_TABLET | Freq: Every evening | ORAL | Status: DC | PRN
  Administered 2018-02-16: 10 mg via ORAL
  Filled 2018-02-15: qty 1

## 2018-02-15 MED ORDER — ONDANSETRON HCL 4 MG/2ML IJ SOLN
INTRAMUSCULAR | Status: DC | PRN
  Administered 2018-02-15: 4 mg via INTRAVENOUS

## 2018-02-15 MED ORDER — FENTANYL CITRATE (PF) 100 MCG/2ML IJ SOLN
INTRAMUSCULAR | Status: AC
  Filled 2018-02-15: qty 2

## 2018-02-15 MED ORDER — BISACODYL 5 MG PO TBEC: 5.0000 mg | DELAYED_RELEASE_TABLET | Freq: Every day | ORAL | Status: DC | PRN

## 2018-02-15 MED ORDER — ONDANSETRON HCL 4 MG/2ML IJ SOLN: 4.0000 mg | Freq: Once | INTRAMUSCULAR | Status: DC | PRN

## 2018-02-15 MED ORDER — BUPIVACAINE-EPINEPHRINE (PF) 0.25% -1:200000 IJ SOLN
INTRAMUSCULAR | Status: AC
  Filled 2018-02-15: qty 30

## 2018-02-15 MED ORDER — OXYCODONE HCL 5 MG PO TABS
5.0000 mg | ORAL_TABLET | ORAL | Status: DC | PRN
  Administered 2018-02-15: 5 mg via ORAL
  Administered 2018-02-16 – 2018-02-17 (×3): 10 mg via ORAL
  Administered 2018-02-17: 5 mg via ORAL
  Filled 2018-02-15 (×6): qty 2
  Filled 2018-02-15: qty 1

## 2018-02-15 MED ORDER — METOCLOPRAMIDE HCL 5 MG/ML IJ SOLN: 5.0000 mg | Freq: Three times a day (TID) | INTRAMUSCULAR | Status: DC | PRN

## 2018-02-15 MED ORDER — PROPOFOL 500 MG/50ML IV EMUL
INTRAVENOUS | Status: AC
  Filled 2018-02-15: qty 50

## 2018-02-15 MED ORDER — HYDROCHLOROTHIAZIDE 25 MG PO TABS: 25.0000 mg | ORAL_TABLET | Freq: Every day | ORAL | Status: DC | PRN

## 2018-02-15 MED ORDER — SUCCINYLCHOLINE CHLORIDE 20 MG/ML IJ SOLN
INTRAMUSCULAR | Status: DC | PRN
  Administered 2018-02-15: 100 mg via INTRAVENOUS

## 2018-02-15 MED ORDER — MENTHOL 3 MG MT LOZG
1.0000 | LOZENGE | OROMUCOSAL | Status: DC | PRN
  Filled 2018-02-15: qty 9

## 2018-02-15 MED ORDER — DEXMEDETOMIDINE HCL 200 MCG/2ML IV SOLN
INTRAVENOUS | Status: DC | PRN
  Administered 2018-02-15: 8 ug via INTRAVENOUS
  Administered 2018-02-15: 20 ug via INTRAVENOUS
  Administered 2018-02-15: 12 ug via INTRAVENOUS

## 2018-02-15 MED ORDER — ACETAMINOPHEN 500 MG PO TABS
1000.0000 mg | ORAL_TABLET | Freq: Four times a day (QID) | ORAL | Status: AC
  Administered 2018-02-15 – 2018-02-16 (×3): 1000 mg via ORAL
  Filled 2018-02-15 (×3): qty 2

## 2018-02-15 MED ORDER — BUPIVACAINE-EPINEPHRINE (PF) 0.25% -1:200000 IJ SOLN
INTRAMUSCULAR | Status: DC | PRN
  Administered 2018-02-15: 30 mL

## 2018-02-15 MED ORDER — OXYCODONE HCL 5 MG PO TABS
10.0000 mg | ORAL_TABLET | ORAL | Status: DC | PRN
  Administered 2018-02-15 – 2018-02-16 (×4): 10 mg via ORAL
  Filled 2018-02-15: qty 3
  Filled 2018-02-15 (×2): qty 2

## 2018-02-15 MED ORDER — ONDANSETRON HCL 4 MG/2ML IJ SOLN
4.0000 mg | Freq: Four times a day (QID) | INTRAMUSCULAR | Status: DC | PRN
  Administered 2018-02-15: 4 mg via INTRAVENOUS
  Filled 2018-02-15: qty 2

## 2018-02-15 MED ORDER — TRAMADOL HCL 50 MG PO TABS
50.0000 mg | ORAL_TABLET | Freq: Four times a day (QID) | ORAL | Status: DC
  Administered 2018-02-15 – 2018-02-17 (×7): 50 mg via ORAL
  Filled 2018-02-15 (×8): qty 1

## 2018-02-15 MED ORDER — BUPIVACAINE LIPOSOME 1.3 % IJ SUSP
INTRAMUSCULAR | Status: AC
  Filled 2018-02-15: qty 20

## 2018-02-15 MED ORDER — ALUM & MAG HYDROXIDE-SIMETH 200-200-20 MG/5ML PO SUSP
30.0000 mL | ORAL | Status: DC | PRN
  Administered 2018-02-16 – 2018-02-17 (×3): 30 mL via ORAL
  Filled 2018-02-15 (×3): qty 30

## 2018-02-15 MED ORDER — MIDAZOLAM HCL 2 MG/2ML IJ SOLN
INTRAMUSCULAR | Status: DC | PRN
  Administered 2018-02-15: 2 mg via INTRAVENOUS

## 2018-02-15 MED ORDER — DIPHENHYDRAMINE HCL 12.5 MG/5ML PO ELIX: 12.5000 mg | ORAL_SOLUTION | ORAL | Status: DC | PRN

## 2018-02-15 MED ORDER — FAMOTIDINE 20 MG PO TABS
ORAL_TABLET | ORAL | Status: AC
  Administered 2018-02-15: 20 mg via ORAL
  Filled 2018-02-15: qty 1

## 2018-02-15 MED ORDER — ONDANSETRON HCL 4 MG PO TABS: 4.0000 mg | ORAL_TABLET | Freq: Four times a day (QID) | ORAL | Status: DC | PRN

## 2018-02-15 MED ORDER — NEOMYCIN-POLYMYXIN B GU 40-200000 IR SOLN
Status: AC
  Filled 2018-02-15: qty 20

## 2018-02-15 MED ORDER — HYDROMORPHONE HCL 1 MG/ML IJ SOLN
INTRAMUSCULAR | Status: DC | PRN
  Administered 2018-02-15 (×2): 0.5 mg via INTRAVENOUS

## 2018-02-15 MED ORDER — GABAPENTIN 300 MG PO CAPS
300.0000 mg | ORAL_CAPSULE | Freq: Three times a day (TID) | ORAL | Status: DC
  Administered 2018-02-15 – 2018-02-17 (×6): 300 mg via ORAL
  Filled 2018-02-15 (×6): qty 1

## 2018-02-15 MED ORDER — PROPOFOL 10 MG/ML IV BOLUS
INTRAVENOUS | Status: DC | PRN
  Administered 2018-02-15: 150 mg via INTRAVENOUS

## 2018-02-15 MED ORDER — MORPHINE SULFATE (PF) 10 MG/ML IV SOLN
INTRAVENOUS | Status: AC
  Filled 2018-02-15: qty 1

## 2018-02-15 MED ORDER — LIDOCAINE HCL (PF) 2 % IJ SOLN
INTRAMUSCULAR | Status: AC
  Filled 2018-02-15: qty 10

## 2018-02-15 MED ORDER — SENNOSIDES-DOCUSATE SODIUM 8.6-50 MG PO TABS
1.0000 | ORAL_TABLET | Freq: Every evening | ORAL | Status: DC | PRN
  Administered 2018-02-16: 1 via ORAL
  Filled 2018-02-15: qty 1

## 2018-02-15 MED ORDER — LORATADINE 10 MG PO TABS
10.0000 mg | ORAL_TABLET | Freq: Every day | ORAL | Status: DC
  Filled 2018-02-15 (×2): qty 1

## 2018-02-15 MED ORDER — LIDOCAINE 2% (20 MG/ML) 5 ML SYRINGE
INTRAMUSCULAR | Status: DC | PRN
  Administered 2018-02-15: 100 mg via INTRAVENOUS

## 2018-02-15 MED ORDER — HYDROMORPHONE HCL 1 MG/ML IJ SOLN: 0.5000 mg | INTRAMUSCULAR | Status: DC | PRN

## 2018-02-15 MED ORDER — LACTATED RINGERS IV SOLN
INTRAVENOUS | Status: DC
  Administered 2018-02-15: 06:00:00 via INTRAVENOUS

## 2018-02-15 MED ORDER — FENTANYL CITRATE (PF) 100 MCG/2ML IJ SOLN
INTRAMUSCULAR | Status: DC | PRN
  Administered 2018-02-15 (×2): 100 ug via INTRAVENOUS
  Administered 2018-02-15: 50 ug via INTRAVENOUS

## 2018-02-15 MED ORDER — SUGAMMADEX SODIUM 200 MG/2ML IV SOLN
INTRAVENOUS | Status: DC | PRN
  Administered 2018-02-15: 151.2 mg via INTRAVENOUS

## 2018-02-15 MED ORDER — SODIUM CHLORIDE 0.9 % IV SOLN
INTRAVENOUS | Status: DC
  Administered 2018-02-15 (×3): via INTRAVENOUS

## 2018-02-15 MED ORDER — CEFAZOLIN SODIUM-DEXTROSE 1-4 GM/50ML-% IV SOLN
1.0000 g | Freq: Four times a day (QID) | INTRAVENOUS | Status: AC
  Administered 2018-02-15 (×2): 1 g via INTRAVENOUS
  Filled 2018-02-15 (×2): qty 50

## 2018-02-15 MED ORDER — SODIUM CHLORIDE 0.9 % IV SOLN
INTRAVENOUS | Status: DC | PRN
  Administered 2018-02-15: 60 mL

## 2018-02-15 MED ORDER — PHENYLEPHRINE HCL 10 MG/ML IJ SOLN
INTRAMUSCULAR | Status: DC | PRN
  Administered 2018-02-15 (×3): 100 ug via INTRAVENOUS

## 2018-02-15 MED ORDER — PHENOL 1.4 % MT LIQD
1.0000 | OROMUCOSAL | Status: DC | PRN
  Filled 2018-02-15: qty 177

## 2018-02-15 MED ORDER — DOCUSATE SODIUM 100 MG PO CAPS
100.0000 mg | ORAL_CAPSULE | Freq: Two times a day (BID) | ORAL | Status: DC
  Administered 2018-02-15 – 2018-02-17 (×4): 100 mg via ORAL
  Filled 2018-02-15 (×4): qty 1

## 2018-02-15 MED ORDER — ASPIRIN 81 MG PO CHEW
81.0000 mg | CHEWABLE_TABLET | Freq: Two times a day (BID) | ORAL | Status: DC
  Administered 2018-02-15 – 2018-02-17 (×4): 81 mg via ORAL
  Filled 2018-02-15 (×4): qty 1

## 2018-02-15 MED ORDER — MORPHINE SULFATE 10 MG/ML IJ SOLN
INTRAMUSCULAR | Status: DC | PRN
  Administered 2018-02-15: 10 mg

## 2018-02-15 MED ORDER — METHOCARBAMOL 1000 MG/10ML IJ SOLN
500.0000 mg | Freq: Four times a day (QID) | INTRAVENOUS | Status: DC | PRN
  Filled 2018-02-15: qty 5

## 2018-02-15 MED ORDER — ZOLPIDEM TARTRATE 5 MG PO TABS: 5.0000 mg | ORAL_TABLET | Freq: Every evening | ORAL | Status: DC | PRN

## 2018-02-15 MED ORDER — MIDAZOLAM HCL 2 MG/2ML IJ SOLN
INTRAMUSCULAR | Status: AC
  Filled 2018-02-15: qty 2

## 2018-02-15 MED ORDER — CEFAZOLIN SODIUM-DEXTROSE 2-4 GM/100ML-% IV SOLN
INTRAVENOUS | Status: AC
  Filled 2018-02-15: qty 100

## 2018-02-15 MED ORDER — NEOMYCIN-POLYMYXIN B GU 40-200000 IR SOLN
Status: DC | PRN
  Administered 2018-02-15: 14 mL

## 2018-02-15 MED ORDER — FENTANYL CITRATE (PF) 100 MCG/2ML IJ SOLN
25.0000 ug | INTRAMUSCULAR | Status: DC | PRN
  Administered 2018-02-15 (×4): 25 ug via INTRAVENOUS

## 2018-02-15 MED ORDER — FAMOTIDINE 20 MG PO TABS
20.0000 mg | ORAL_TABLET | Freq: Once | ORAL | Status: AC
  Administered 2018-02-15: 20 mg via ORAL

## 2018-02-15 MED ORDER — METOCLOPRAMIDE HCL 10 MG PO TABS: 5.0000 mg | ORAL_TABLET | Freq: Three times a day (TID) | ORAL | Status: DC | PRN

## 2018-02-15 MED ORDER — DEXAMETHASONE SODIUM PHOSPHATE 10 MG/ML IJ SOLN
INTRAMUSCULAR | Status: DC | PRN
  Administered 2018-02-15: 10 mg via INTRAVENOUS

## 2018-02-15 MED ORDER — MAGNESIUM CITRATE PO SOLN
1.0000 | Freq: Once | ORAL | Status: AC | PRN
  Administered 2018-02-17: 1 via ORAL
  Filled 2018-02-15 (×2): qty 296

## 2018-02-15 MED ORDER — METHOCARBAMOL 500 MG PO TABS
500.0000 mg | ORAL_TABLET | Freq: Four times a day (QID) | ORAL | Status: DC | PRN
  Administered 2018-02-15 – 2018-02-17 (×3): 500 mg via ORAL
  Filled 2018-02-15 (×3): qty 1

## 2018-02-15 MED ORDER — SODIUM CHLORIDE 0.9 % IJ SOLN
INTRAMUSCULAR | Status: AC
  Filled 2018-02-15: qty 50

## 2018-02-15 MED ORDER — ROCURONIUM BROMIDE 100 MG/10ML IV SOLN
INTRAVENOUS | Status: DC | PRN
  Administered 2018-02-15 (×2): 10 mg via INTRAVENOUS
  Administered 2018-02-15: 30 mg via INTRAVENOUS

## 2018-02-15 MED ORDER — ACETAMINOPHEN 325 MG PO TABS: 325.0000 mg | ORAL_TABLET | Freq: Four times a day (QID) | ORAL | Status: DC | PRN

## 2018-02-15 SURGICAL SUPPLY — 66 items
BANDAGE ACE 6X5 VEL STRL LF (GAUZE/BANDAGES/DRESSINGS) ×2 IMPLANT
BLADE SAW 1 (BLADE) ×2 IMPLANT
BLOCK CUTTING TIBIAL 2 RT (MISCELLANEOUS) ×2 IMPLANT
BLOCK CUTTING TIBIAL 4 RT MIS (MISCELLANEOUS) ×2 IMPLANT
CANISTER SUCT 1200ML W/VALVE (MISCELLANEOUS) ×2 IMPLANT
CANISTER SUCT 3000ML PPV (MISCELLANEOUS) ×4 IMPLANT
CEMENT HV SMART SET (Cement) ×4 IMPLANT
CHLORAPREP W/TINT 26ML (MISCELLANEOUS) ×4 IMPLANT
COOLER POLAR GLACIER W/PUMP (MISCELLANEOUS) ×2 IMPLANT
COVER WAND RF STERILE (DRAPES) ×2 IMPLANT
CUFF TOURN 24 STER (MISCELLANEOUS) IMPLANT
CUFF TOURN 30 STER DUAL PORT (MISCELLANEOUS) IMPLANT
DRAPE SHEET LG 3/4 BI-LAMINATE (DRAPES) ×4 IMPLANT
ELECT CAUTERY BLADE 6.4 (BLADE) ×2 IMPLANT
ELECT REM PT RETURN 9FT ADLT (ELECTROSURGICAL) ×2
ELECTRODE REM PT RTRN 9FT ADLT (ELECTROSURGICAL) ×1 IMPLANT
FEM COMP SZ3 RIGHT (Joint) ×2 IMPLANT
FEMUR BONE MODEL (MISCELLANEOUS) ×2 IMPLANT
FEMUR CUTTING BLOCK S3+ R (MISCELLANEOUS) ×2 IMPLANT
GAUZE PETRO XEROFOAM 1X8 (MISCELLANEOUS) ×2 IMPLANT
GAUZE SPONGE 4X4 12PLY STRL (GAUZE/BANDAGES/DRESSINGS) ×2 IMPLANT
GLOVE BIOGEL PI IND STRL 9 (GLOVE) ×1 IMPLANT
GLOVE BIOGEL PI INDICATOR 9 (GLOVE) ×1
GLOVE INDICATOR 8.0 STRL GRN (GLOVE) ×2 IMPLANT
GLOVE SURG ORTHO 8.0 STRL STRW (GLOVE) ×2 IMPLANT
GLOVE SURG SYN 9.0  PF PI (GLOVE) ×1
GLOVE SURG SYN 9.0 PF PI (GLOVE) ×1 IMPLANT
GOWN SRG 2XL LVL 4 RGLN SLV (GOWNS) ×1 IMPLANT
GOWN STRL NON-REIN 2XL LVL4 (GOWNS) ×1
GOWN STRL REUS W/ TWL LRG LVL3 (GOWN DISPOSABLE) ×1 IMPLANT
GOWN STRL REUS W/ TWL XL LVL3 (GOWN DISPOSABLE) ×1 IMPLANT
GOWN STRL REUS W/TWL LRG LVL3 (GOWN DISPOSABLE) ×1
GOWN STRL REUS W/TWL XL LVL3 (GOWN DISPOSABLE) ×1
HOLDER FOLEY CATH W/STRAP (MISCELLANEOUS) ×2 IMPLANT
HOOD PEEL AWAY FLYTE STAYCOOL (MISCELLANEOUS) ×4 IMPLANT
INST EFFICIENCY KNEE REPLACEMT (INSTRUMENTS) ×2 IMPLANT
KIT TURNOVER KIT A (KITS) ×2 IMPLANT
KNEE TIBIAL INSERT 2 02070217P (Insert) ×2 IMPLANT
KNIFE SCULPS 14X20 (INSTRUMENTS) ×2 IMPLANT
NDL SAFETY ECLIPSE 18X1.5 (NEEDLE) ×1 IMPLANT
NEEDLE HYPO 18GX1.5 SHARP (NEEDLE) ×1
NEEDLE SPNL 18GX3.5 QUINCKE PK (NEEDLE) ×2 IMPLANT
NEEDLE SPNL 20GX3.5 QUINCKE YW (NEEDLE) ×2 IMPLANT
NS IRRIG 1000ML POUR BTL (IV SOLUTION) ×2 IMPLANT
PACK TOTAL KNEE (MISCELLANEOUS) ×2 IMPLANT
PAD WRAPON POLAR KNEE (MISCELLANEOUS) ×1 IMPLANT
PATELLA RESURFACING MEDACTA 02 (Bone Implant) ×2 IMPLANT
PULSAVAC PLUS IRRIG FAN TIP (DISPOSABLE) ×2
SCALPEL PROTECTED #10 DISP (BLADE) ×4 IMPLANT
SOL .9 NS 3000ML IRR  AL (IV SOLUTION) ×1
SOL .9 NS 3000ML IRR UROMATIC (IV SOLUTION) ×1 IMPLANT
STAPLER SKIN PROX 35W (STAPLE) ×2 IMPLANT
STEM EXTENSION 11MMX30MM (Stem) ×2 IMPLANT
SUCTION FRAZIER HANDLE 10FR (MISCELLANEOUS) ×1
SUCTION TUBE FRAZIER 10FR DISP (MISCELLANEOUS) ×1 IMPLANT
SUT DVC 2 QUILL PDO  T11 36X36 (SUTURE) ×1
SUT DVC 2 QUILL PDO T11 36X36 (SUTURE) ×1 IMPLANT
SUT V-LOC 90 ABS DVC 3-0 CL (SUTURE) ×2 IMPLANT
SYR 20CC LL (SYRINGE) ×2 IMPLANT
SYR 50ML LL SCALE MARK (SYRINGE) ×4 IMPLANT
TIB TRAY SZ 2 R FIXED (Joint) ×2 IMPLANT
TIP FAN IRRIG PULSAVAC PLUS (DISPOSABLE) ×1 IMPLANT
TOWEL OR 17X26 4PK STRL BLUE (TOWEL DISPOSABLE) ×2 IMPLANT
TOWER CARTRIDGE SMART MIX (DISPOSABLE) ×2 IMPLANT
TRAY FOLEY MTR SLVR 16FR STAT (SET/KITS/TRAYS/PACK) ×2 IMPLANT
WRAPON POLAR PAD KNEE (MISCELLANEOUS) ×2

## 2018-02-15 NOTE — Anesthesia Procedure Notes (Signed)
Procedure Name: Intubation Date/Time: 02/15/2018 8:05 AM Performed by: Paulette Blanch, CRNA Pre-anesthesia Checklist: Patient identified, Patient being monitored, Timeout performed, Emergency Drugs available and Suction available Patient Re-evaluated:Patient Re-evaluated prior to induction Oxygen Delivery Method: Circle system utilized Preoxygenation: Pre-oxygenation with 100% oxygen Induction Type: IV induction Ventilation: Mask ventilation without difficulty Laryngoscope Size: 3 and Miller Grade View: Grade I Tube type: Oral Tube size: 7.5 mm Number of attempts: 1 Placement Confirmation: ETT inserted through vocal cords under direct vision,  positive ETCO2 and breath sounds checked- equal and bilateral Secured at: 21 cm Tube secured with: Tape Dental Injury: Teeth and Oropharynx as per pre-operative assessment

## 2018-02-15 NOTE — Progress Notes (Signed)
Good strong bounding pulse palpated In right foot. Foot warm to touch, pink in color.

## 2018-02-15 NOTE — Progress Notes (Signed)
15 minute call to floor. 

## 2018-02-15 NOTE — H&P (Signed)
Reviewed paper H+P, will be scanned into chart. No changes noted.  

## 2018-02-15 NOTE — Op Note (Signed)
02/15/2018  9:12 AM  PATIENT:  Belinda Mitchell  62 y.o. female  PRE-OPERATIVE DIAGNOSIS:  RIGHT KNEE PAIN, primary osteoarthritis  POST-OPERATIVE DIAGNOSIS:  RIGHT KNEE PAIN same  PROCEDURE:  Procedure(s): TOTAL KNEE ARTHROPLASTY (Right)  SURGEON: Leitha Schuller, MD  ASSISTANTS: Cranston Neighbor, PA-C  ANESTHESIA:   general  EBL:  Total I/O In: 800 [I.V.:800] Out: 175 [Urine:150; Blood:25]  BLOOD ADMINISTERED:none  DRAINS: none   LOCAL MEDICATIONS USED:  MARCAINE    and OTHER morphine and Exparel  SPECIMEN:  No Specimen  DISPOSITION OF SPECIMEN:  N/A  COUNTS:  YES  TOURNIQUET:  * Missing tourniquet times found for documented tourniquets in log: [id number] *  IMPLANTS: Medacta 3 GMK PS, 17 mm PS tibial insert with 2 tibia and short stem, 2 patella all components cemented  DICTATION: .Dragon Dictation patient was brought to the operating room and after adequate general anesthesia was obtained the right leg was prepped and draped in the usual sterile fashion.  After patient identification and timeout procedure were completed, a midline skin incision was made followed by a medial parapatellar arthrotomy.  There is moderate synovitis throughout the knee with eburnated bone in the lateral compartment and patellofemoral joint with mild arthritis to the medial compartment.  The fat pad and ACL and PCL were excised.  The proximal tibia was exposed and the proximal tibia cutting guide applied and proximal tibia cut carried out.  The distal femoral cut was carried out with the cutting block as well and adequate resection noted for and the extension gap.  The size 3 cutting guide was applied anterior posterior chamfer cuts made on the femur with the meniscal horns removed at this time.  With the PCL complete really released the 2 cutting block applied to the tibia the proximal tibia was prepared for short stem in the first 3 femur placed with a 17 mm insert giving good stability throughout a range  of motion and full extension obtained without difficulty.  The distal femoral drill holes were made not to cut for the trochlear groove and PCL box cut also made.  These trials were removed and the patella was cut using the patellar cutting guide drill holes were made in the patella sized to a size 2.  The tourniquet was raised after the initial bone cuts because of bone losing the bony surfaces were thoroughly irrigated and dried.  The tibial component was cemented to place first with a polyethylene was placed with set screw tightened with a torque screwdriver.  The femoral component was placed with excess cement being removed and the knee held in extension as the patellar button was clamped into place.  When the cemented set the excess amount around the periphery was removed and the patella tracked well with no touch technique.  The tourniquet was let down and the knee thoroughly irrigated.  The wound was closed with a heavy Quill for the arthrotomy followed by a 3-0 v-LOC subcuticular closure followed by skin staples and incisional wound VAC area  PLAN OF CARE: Admit to inpatient   PATIENT DISPOSITION:  PACU - hemodynamically stable.

## 2018-02-15 NOTE — NC FL2 (Signed)
[employer] MEDICAID FL2 LEVEL OF CARE SCREENING TOOL     IDENTIFICATION  Patient Name: Belinda Mitchell Birthdate: [birth year] Sex: female Admission Date (Current Location): 02/15/2018  [place] and [place] Number:  Chiropodist and Address:  [facility], [address] [place], [place] [address]      Provider Number: [id number]  Attending Physician Name and Address:  Kennedy Bucker, MD  Relative Name and Phone Number:  Myrtha Mantis- brother [phone]    Current Level of Care: Hospital Recommended Level of Care: Skilled Nursing Facility Prior Approval Number:    Date Approved/Denied:   PASRR Number: [id number]  Discharge Plan: SNF    Current Diagnoses: Patient Active Problem List   Diagnosis Date Noted  . S/P TKR (total knee replacement) using cement, right 02/15/2018  . Primary osteoarthritis of left knee 04/07/2016    Orientation RESPIRATION BLADDER Height & Weight     Self, Time, Situation, Place  Normal Continent Weight:   Height:     BEHAVIORAL SYMPTOMS/MOOD NEUROLOGICAL BOWEL NUTRITION STATUS  (none) (None) Continent Diet(Regular)  AMBULATORY STATUS COMMUNICATION OF NEEDS Skin   Extensive Assist Verbally Other (Comment)(Closed incision right knee and closed incision right foot)                       Personal Care Assistance Level of Assistance  Bathing, Feeding, Dressing Bathing Assistance: Limited assistance Feeding assistance: Independent Dressing Assistance: Limited assistance     Functional Limitations Info  Sight, Hearing, Speech Sight Info: Adequate Hearing Info: Adequate Speech Info: Adequate    SPECIAL CARE FACTORS FREQUENCY  PT (By licensed PT), OT (By licensed OT)     PT Frequency: 5 OT Frequency: 5            Contractures Contractures Info: Not present    Additional Factors Info  Code Status, Allergies Code Status Info: Full Code  Allergies Info: NKA           Current Medications  (02/15/2018):  This is the current hospital active medication list Current Facility-Administered Medications  Medication Dose Route Frequency Provider Last Rate Last Dose  . 0.9 %  sodium chloride infusion   Intravenous Continuous Kennedy Bucker, MD 75 mL/hr at 02/15/18 1528    . acetaminophen (TYLENOL) tablet 1,000 mg  1,000 mg Oral Q6H Kennedy Bucker, MD   1,000 mg at 02/15/18 1112  . [START ON 02/16/2018] acetaminophen (TYLENOL) tablet 325-650 mg  325-650 mg Oral Q6H PRN Kennedy Bucker, MD      . alum & mag hydroxide-simeth (MAALOX/MYLANTA) 200-200-20 MG/5ML suspension 30 mL  30 mL Oral Q4H PRN Kennedy Bucker, MD      . aspirin chewable tablet 81 mg  81 mg Oral BID Kennedy Bucker, MD      . bisacodyl (DULCOLAX) EC tablet 5 mg  5 mg Oral Daily PRN Kennedy Bucker, MD      . ceFAZolin (ANCEF) 2-4 GM/100ML-% IVPB           . ceFAZolin (ANCEF) IVPB 1 g/50 mL premix  1 g Intravenous Q6H Kennedy Bucker, MD   Stopped at 02/15/18 1434  . cyclobenzaprine (FLEXERIL) tablet 10 mg  10 mg Oral QHS PRN Kennedy Bucker, MD      . diphenhydrAMINE (BENADRYL) 12.5 MG/5ML elixir 12.5-25 mg  12.5-25 mg Oral Q4H PRN Kennedy Bucker, MD      . docusate sodium (COLACE) capsule 100 mg  100 mg Oral BID Kennedy Bucker, MD      .  fentaNYL (SUBLIMAZE) 100 MCG/2ML injection           . gabapentin (NEURONTIN) capsule 300 mg  300 mg Oral TID Kennedy Bucker, MD      . hydrochlorothiazide (HYDRODIURIL) tablet 25 mg  25 mg Oral Daily PRN Kennedy Bucker, MD      . HYDROmorphone (DILAUDID) injection 0.5-1 mg  0.5-1 mg Intravenous Q4H PRN Kennedy Bucker, MD      . loratadine (CLARITIN) tablet 10 mg  10 mg Oral Daily Kennedy Bucker, MD      . magnesium citrate solution 1 Bottle  1 Bottle Oral Once PRN Kennedy Bucker, MD      . menthol-cetylpyridinium (CEPACOL) lozenge 3 mg  1 lozenge Oral PRN Kennedy Bucker, MD       Or  . phenol (CHLORASEPTIC) mouth spray 1 spray  1 spray Mouth/Throat PRN Kennedy Bucker, MD      . methocarbamol (ROBAXIN) tablet 500  mg  500 mg Oral Q6H PRN Kennedy Bucker, MD   500 mg at 02/15/18 1206   Or  . methocarbamol (ROBAXIN) 500 mg in dextrose 5 % 50 mL IVPB  500 mg Intravenous Q6H PRN Kennedy Bucker, MD      . metoCLOPramide (REGLAN) tablet 5-10 mg  5-10 mg Oral Q8H PRN Kennedy Bucker, MD       Or  . metoCLOPramide (REGLAN) injection 5-10 mg  5-10 mg Intravenous Q8H PRN Kennedy Bucker, MD      . ondansetron [facility]) tablet 4 mg  4 mg Oral Q6H PRN Kennedy Bucker, MD       Or  . ondansetron [facility]) injection 4 mg  4 mg Intravenous Q6H PRN Kennedy Bucker, MD      . oxyCODONE (Oxy IR/ROXICODONE) immediate release tablet 10-15 mg  10-15 mg Oral Q4H PRN Kennedy Bucker, MD   10 mg at 02/15/18 1558  . oxyCODONE (Oxy IR/ROXICODONE) immediate release tablet 5-10 mg  5-10 mg Oral Q4H PRN Kennedy Bucker, MD   5 mg at 02/15/18 1207  . senna-docusate (Senokot-S) tablet 1 tablet  1 tablet Oral QHS PRN Kennedy Bucker, MD      . traMADol Janean Sark) tablet 50 mg  50 mg Oral Q6H Kennedy Bucker, MD   50 mg at 02/15/18 1112  . zolpidem (AMBIEN) tablet 5 mg  5 mg Oral QHS PRN Kennedy Bucker, MD         Discharge Medications: Please see discharge summary for a list of discharge medications.  Relevant Imaging Results:  Relevant Lab Results:   Additional Information SSN: [id number]  Ruthe Mannan, [place]

## 2018-02-15 NOTE — Transfer of Care (Signed)
Immediate Anesthesia Transfer of Care Note  Patient: Belinda Mitchell  Procedure(s) Performed: TOTAL KNEE ARTHROPLASTY (Right Knee)  Patient Location: PACU  Anesthesia Type:General  Level of Consciousness: awake, alert  and oriented  Airway & Oxygen Therapy: Patient Spontanous Breathing and Patient connected to face mask oxygen  Post-op Assessment: Report given to RN and Post -op Vital signs reviewed and stable  Post vital signs: Reviewed and stable  Last Vitals:  Vitals Value Taken Time  BP 150/89 02/15/2018  9:17 AM  Temp    Pulse 73 02/15/2018  9:20 AM  Resp 17 02/15/2018  9:20 AM  SpO2 94 % 02/15/2018  9:20 AM  Vitals shown include unvalidated device data.  Last Pain:  Vitals:   02/15/18 0559  TempSrc: Temporal  PainSc: 3          Complications: No apparent anesthesia complications

## 2018-02-15 NOTE — Progress Notes (Signed)
In between pain medication doses, patient drifts off to sleep, arouses to voice easily.

## 2018-02-15 NOTE — Anesthesia Preprocedure Evaluation (Signed)
Anesthesia Evaluation  Patient identified by MRN, date of birth, ID band Patient awake    Reviewed: Allergy & Precautions, H&P , NPO status , Patient's Chart, lab work & pertinent test results, reviewed documented beta blocker date and time   Airway Mallampati: II  TM Distance: >3 FB Neck ROM: full    Dental  (+) Teeth Intact   Pulmonary neg pulmonary ROS, neg shortness of breath, pneumonia, resolved, former smoker,    Pulmonary exam normal        Cardiovascular negative cardio ROS Normal cardiovascular exam Rhythm:regular Rate:Normal     Neuro/Psych PSYCHIATRIC DISORDERS Hx of lbp sp lami. occ lbp intermittently.  ja negative neurological ROS  negative psych ROS   GI/Hepatic negative GI ROS, Neg liver ROS,   Endo/Other  negative endocrine ROS  Renal/GU negative Renal ROS  negative genitourinary   Musculoskeletal   Abdominal   Peds  Hematology negative hematology ROS (+)   Anesthesia Other Findings Past Medical History: No date: Arthritis     Comment: back, hips, knees No date: Degenerative disc disease, lumbar     Comment: L5 Aug 28, 2015: Depression     Comment: situational death of parents 08-28-10: Pneumonia Past Surgical History: No date: ABDOMINAL HYSTERECTOMY 01/29/2016: ARTHRODESIS METATARSALPHALANGEAL JOINT (MTPJ) Right     Comment: Procedure: ARTHRODESIS 1ST               METATARSALPHALANGEAL JOINT (MTPJ);  Surgeon:               Gwyneth Revels, DPM;  Location: [facility];  Service: Podiatry;  Laterality: Right; No date: BACK SURGERY No date: CHOLECYSTECTOMY No date: excision of redundant skin No date: FOOT SURGERY 01/29/2016: HARDWARE REMOVAL Right     Comment: Procedure: HARDWARE REMOVAL;  Surgeon: Gwyneth Revels, DPM;  Location: [facility];                Service: Podiatry;  Laterality: Right;  PLATE               AND 5 SCREWS REMOVED No date: MENISCUS  REPAIR No date: ROTATOR CUFF REPAIR No date: toe amputation No date: TONSILLECTOMY   Reproductive/Obstetrics negative OB ROS                             Anesthesia Physical  Anesthesia Plan  ASA: II  Anesthesia Plan: General ETT   Post-op Pain Management:    Induction: Intravenous  PONV Risk Score and Plan:   Airway Management Planned: Oral ETT  Additional Equipment:   Intra-op Plan:   Post-operative Plan: Extubation in OR  Informed Consent: I have reviewed the patients History and Physical, chart, labs and discussed the procedure including the risks, benefits and alternatives for the proposed anesthesia with the patient or authorized representative who has indicated his/her understanding and acceptance.   Dental Advisory Given  Plan Discussed with: CRNA  Anesthesia Plan Comments:         Anesthesia Quick Evaluation

## 2018-02-15 NOTE — Anesthesia Post-op Follow-up Note (Signed)
Anesthesia QCDR form completed.        

## 2018-02-15 NOTE — Evaluation (Signed)
Physical Therapy Evaluation Patient Details Name: Belinda Mitchell MRN: [id number] DOB: May 13, 1955 Today's Date: 02/15/2018   History of Present Illness  admitted for acute hospitalization status post R TKR, 02/15/18, WBAT.  Clinical Impression  Upon evaluation, patient alert and oriented; follows commands and demonstrates good effort with all mobility tasks.  Very eager to return home at discharge.  Demonstrates good post-op strength (at least 3-/5) and ROM (7-105 degrees) to R knee, limited by pain at times.  Good R quad control/activation; minimal/no lag with SLR noted.  Able to complete bed mobility with sup; sit/stand, basic transfers and gait (5') with RW, cga.  Min cuing for R TKE in loading phases of gait, and for overall safety (slightly impulsive at times).  Anticipate good progression towards all mobility goals in subsequent sessions. Would benefit from skilled PT to address above deficits and promote optimal return to PLOF; Recommend transition to HHPT upon discharge from acute hospitalization.     Follow Up Recommendations Home health PT    Equipment Recommendations       Recommendations for Other Services       Precautions / Restrictions Precautions Precautions: Fall Restrictions Weight Bearing Restrictions: Yes RLE Weight Bearing: Weight bearing as tolerated      Mobility  Bed Mobility Overal bed mobility: Needs Assistance Bed Mobility: Supine to Sit     Supine to sit: Supervision        Transfers Overall transfer level: Needs assistance Equipment used: Rolling walker (2 wheeled) Transfers: Sit to/from Stand Sit to Stand: Min guard         General transfer comment: slightly impulsive with movement transitions, but fair/good use of R LE with movement transition  Ambulation/Gait Ambulation/Gait assistance: Min guard Gait Distance (Feet): 5 Feet Assistive device: Rolling walker (2 wheeled)       General Gait Details: 3-point, step to gait pattern  with fair stance time; min cuing for R TKE in loading phase.  Patient declined additional distance due to pain  Stairs            Wheelchair Mobility    Modified Rankin (Stroke Patients Only)       Balance Overall balance assessment: Needs assistance Sitting-balance support: Feet supported;No upper extremity supported Sitting balance-Leahy Scale: Good     Standing balance support: Bilateral upper extremity supported Standing balance-Leahy Scale: Fair                               Pertinent Vitals/Pain Pain Assessment: 0-10 Pain Score: 5  Pain Location: R knee Pain Descriptors / Indicators: Aching;Grimacing;Guarding Pain Intervention(s): Limited activity within patient's tolerance;Patient requesting pain meds-RN notified;Monitored during session;Repositioned    Home Living Family/patient expects to be discharged to:: Private residence Living Arrangements: Alone Available Help at Discharge: Family(brother planning to stay with patient at discharge) Type of Home: House Home Access: Stairs to enter Entrance Stairs-Rails: Doctor, general practice of Steps: 3 Home Layout: One level        Prior Function Level of Independence: Independent         Comments: Indep with ADls, household and community distances; + driving; enjoys gardening and working outside.     Hand Dominance   Dominant Hand: Right    Extremity/Trunk Assessment   Upper Extremity Assessment Upper Extremity Assessment: Overall WFL for tasks assessed    Lower Extremity Assessment Lower Extremity Assessment: (R knee grossly at least 3-/5, limited by pain; sensation fully intact.  Otherwise, bilat LEs grossly WFL)       Communication   Communication: No difficulties  Cognition Arousal/Alertness: Awake/alert Behavior During Therapy: WFL for tasks assessed/performed Overall Cognitive Status: Within Functional Limits for tasks assessed                                         General Comments      Exercises Total Joint Exercises Goniometric ROM: R knee: 7-105 degrees Other Exercises Other Exercises: Supine R LE therex, 1x10, AROM for muscular strength/endurance: ankle pumps, quad sets, SAQs, heel slides, hip abduct/adduct and SLR.  Good quad activation and control; minimal/no lag with R LE SLR (improving with repetition)   Assessment/Plan    PT Assessment Patient needs continued PT services  PT Problem List Decreased strength;Decreased range of motion;Decreased activity tolerance;Decreased knowledge of use of DME;Decreased balance;Decreased mobility;Decreased coordination;Decreased knowledge of precautions;Pain;Decreased skin integrity       PT Treatment Interventions DME instruction;Gait training;Stair training;Functional mobility training;Balance training;Therapeutic activities;Therapeutic exercise;Patient/family education    PT Goals (Current goals can be found in the Care Plan section)  Acute Rehab PT Goals Patient Stated Goal: "I'm determined to go home" PT Goal Formulation: With patient Time For Goal Achievement: 03/01/18 Potential to Achieve Goals: Good    Frequency BID   Barriers to discharge        Co-evaluation               AM-PAC PT "6 Clicks" Daily Activity  Outcome Measure Difficulty turning over in bed (including adjusting bedclothes, sheets and blankets)?: A Little Difficulty moving from lying on back to sitting on the side of the bed? : A Little Difficulty sitting down on and standing up from a chair with arms (e.g., wheelchair, bedside commode, etc,.)?: Unable Help needed moving to and from a bed to chair (including a wheelchair)?: A Little Help needed walking in hospital room?: A Little Help needed climbing 3-5 steps with a railing? : A Little 6 Click Score: 16    End of Session Equipment Utilized During Treatment: Gait belt Activity Tolerance: Patient tolerated treatment well Patient left: in  chair;with call bell/phone within reach;with chair alarm set Nurse Communication: Mobility status PT Visit Diagnosis: Muscle weakness (generalized) (M62.81);Difficulty in walking, not elsewhere classified (R26.2);Pain Pain - Right/Left: Right Pain - part of body: Knee    Time: 1610-9604 PT Time Calculation (min) (ACUTE ONLY): 26 min   Charges:   PT Evaluation $PT Eval Low Complexity: 1 Low PT Treatments $Therapeutic Exercise: 8-22 mins       Breion Novacek H. Manson Passey, PT, DPT, NCS 02/15/18, 4:13 PM [phone]

## 2018-02-16 LAB — BASIC METABOLIC PANEL
ANION GAP: 7 (ref 5–15)
BUN: 12 mg/dL (ref 8–23)
CHLORIDE: 104 mmol/L (ref 98–111)
CO2: 28 mmol/L (ref 22–32)
Calcium: 9 mg/dL (ref 8.9–10.3)
Creatinine, Ser: 0.46 mg/dL (ref 0.44–1.00)
GFR calc non Af Amer: >60 mL/min (ref >60)
Glucose, Bld: 115 mg/dL — ABNORMAL HIGH (ref 70–99)
POTASSIUM: 3.7 mmol/L (ref 3.5–5.1)
SODIUM: 139 mmol/L (ref 135–145)

## 2018-02-16 LAB — CBC
HCT: 39.9 % (ref 36.0–46.0)
HEMOGLOBIN: 13.1 g/dL (ref 12.0–15.0)
MCH: 29.7 pg (ref 26.0–34.0)
MCHC: 32.8 g/dL (ref 30.0–36.0)
MCV: 90.5 fL (ref 80.0–100.0)
NRBC: 0 % (ref 0.0–0.2)
Platelets: 409 10*3/uL — ABNORMAL HIGH (ref 150–400)
RBC: 4.41 MIL/uL (ref 3.87–5.11)
RDW: 13 % (ref 11.5–15.5)
WBC: 11.1 10*3/uL — ABNORMAL HIGH (ref 4.0–10.5)

## 2018-02-16 MED ORDER — MAGNESIUM HYDROXIDE 400 MG/5ML PO SUSP
30.0000 mL | Freq: Once | ORAL | Status: AC
  Administered 2018-02-16: 30 mL via ORAL
  Filled 2018-02-16: qty 30

## 2018-02-16 NOTE — Progress Notes (Signed)
Physical Therapy Treatment Patient Details Name: Belinda Mitchell MRN: [id number] DOB: [birth year] Today's Date: 02/16/2018    History of Present Illness admitted for acute hospitalization status post R TKR, 02/15/18, WBAT.    PT Comments    Patient with good mobility progression this date, completing full lap around nursing station with RW, sup.  Very brisk in functional movement; intermittent cuing for slower, controlled movements to maximize safety with activity. Will plan to initiate stairs this PM.    Follow Up Recommendations  Home health PT     Equipment Recommendations       Recommendations for Other Services       Precautions / Restrictions Precautions Precautions: Fall Restrictions Weight Bearing Restrictions: Yes RLE Weight Bearing: Weight bearing as tolerated    Mobility  Bed Mobility Overal bed mobility: Modified Independent                Transfers Overall transfer level: Modified independent Equipment used: Rolling walker (2 wheeled) Transfers: Sit to/from Stand Sit to Stand: Modified independent (Device/Increase time)         General transfer comment: very quick to initiate movement transitions; cuing for slower, controlled movements to maximize safety  Ambulation/Gait Ambulation/Gait assistance: Supervision Gait Distance (Feet): 200 Feet Assistive device: Rolling walker (2 wheeled)   Gait velocity: 10' walk time, 7-8 seconds   General Gait Details: progression to reciprocal stepping pattern with good (nearly symmetrical) stance time bilat LEs; steady cadence without buckling or LOB. Min cuing for postural extension and R TKE in stance.   Stairs             Wheelchair Mobility    Modified Rankin (Stroke Patients Only)       Balance Overall balance assessment: Needs assistance Sitting-balance support: No upper extremity supported;Feet supported Sitting balance-Leahy Scale: Good     Standing balance support: Bilateral  upper extremity supported Standing balance-Leahy Scale: Good                              Cognition Arousal/Alertness: Awake/alert Behavior During Therapy: WFL for tasks assessed/performed Overall Cognitive Status: Within Functional Limits for tasks assessed                                        Exercises Total Joint Exercises Goniometric ROM: R knee: 3-110 degrees Other Exercises Other Exercises: Sit/stand x10 with RW, sup-emphasis on symmetrical foot placement and WBing.  Good functional ROM to R knee noted Other Exercises: Seated R LE therex, 1x15, for strenght/flexibility: ankle pumps, LAQs, self-flexion activities, marching.  Good isolated muscle activation and knee control. Other Exercises: Toilet transfer, ambulatory with RW, sup/mod indep    General Comments        Pertinent Vitals/Pain Pain Assessment: 0-10 Pain Score: 5  Pain Location: R knee Pain Descriptors / Indicators: Aching;Grimacing;Guarding Pain Intervention(s): Limited activity within patient's tolerance;Monitored during session;Premedicated before session;Repositioned    Home Living                      Prior Function            PT Goals (current goals can now be found in the care plan section) Acute Rehab PT Goals Patient Stated Goal: "I'm determined to go home" PT Goal Formulation: With patient Time For Goal Achievement: 03/01/18 Potential to Achieve Goals: Good  Progress towards PT goals: Progressing toward goals    Frequency    BID      PT Plan Current plan remains appropriate    Co-evaluation              AM-PAC PT "6 Clicks" Daily Activity  Outcome Measure  Difficulty turning over in bed (including adjusting bedclothes, sheets and blankets)?: None Difficulty moving from lying on back to sitting on the side of the bed? : None Difficulty sitting down on and standing up from a chair with arms (e.g., wheelchair, bedside commode, etc,.)?:  None Help needed moving to and from a bed to chair (including a wheelchair)?: A Little Help needed walking in hospital room?: A Little Help needed climbing 3-5 steps with a railing? : A Little 6 Click Score: 21    End of Session Equipment Utilized During Treatment: Gait belt Activity Tolerance: Patient tolerated treatment well Patient left: in chair;with call bell/phone within reach;with chair alarm set Nurse Communication: Mobility status PT Visit Diagnosis: Muscle weakness (generalized) (M62.81);Difficulty in walking, not elsewhere classified (R26.2);Pain Pain - Right/Left: Right Pain - part of body: Knee     Time: 1610-9604 PT Time Calculation (min) (ACUTE ONLY): 19 min  Charges:  $Gait Training: 8-22 mins            Adolphe Fortunato H. Manson Passey, PT, DPT, NCS 02/16/18, 10:23 AM [phone]

## 2018-02-16 NOTE — Progress Notes (Signed)
Clinical Social Worker (CSW) received SNF consult. PT is recommending home health. RN case manager aware of above. Please reconsult if future social work needs arise. CSW signing off.   Clayton Bosserman, LCSW [phone] 

## 2018-02-16 NOTE — Evaluation (Signed)
Occupational Therapy Evaluation Patient Details Name: Belinda Mitchell MRN: [id number] DOB: 1955-06-12 Today's Date: 02/16/2018    History of Present Illness admitted for acute hospitalization status post R TKR, 02/15/18, WBAT.   Clinical Impression   Pt seen for OT evaluation this date, POD#1 from above surgery. Pt is a 62 y/o female who lives alone in a house with three step to enter. Brother, who lives at [address], is available 24/7 as needed while she recovers. Pt was very eager and stated " I am very independent".  Pt was independent in all ADLs prior to surgery. Pt is eager to return to PLOF with less pain and improved safety and independence. Pt currently requires PRN minimal assist for sit<>stand LB dressing and seated bathing position due to pain and limited AROM of R knee. Pt instructed in polar care mgt, falls prevention strategies, home/routines modifications, DME/AE for LB bathing and dressing tasks, pet care mgt and compression stocking mgt. Pt verbalized understanding of all education presented at this time. Pt would benefit from skilled OT services including additional instruction in dressing techniques with or without assistive devices for dressing and bathing skills to support recall and carryover prior to discharge and ultimately to maximize safety, independence, and minimize falls risk and caregiver burden. Do not currently anticipate any OT needs following this hospitalization.      Follow Up Recommendations  No OT follow up    Equipment Recommendations  (reacher and sock aid)    Recommendations for Other Services       Precautions / Restrictions Precautions Precautions: Fall Restrictions Weight Bearing Restrictions: Yes RLE Weight Bearing: Weight bearing as tolerated      Mobility Bed Mobility             General bed mobility comments: deferred; pt presented in recliner  Transfers Overall transfer level: Modified independent Equipment used: Rolling walker  (2 wheeled) Transfers: Sit to/from Stand Sit to Stand: Modified independent (Device/Increase time)         General transfer comment: very quick to initiate movement transitions; cues required to slow down movements    Balance     Standing balance support: Bilateral upper extremity supported Standing balance-Leahy Scale: Fair                             ADL either performed or assessed with clinical judgement   ADL Overall ADL's : Needs assistance/impaired Eating/Feeding: Independent   Grooming: Modified independent;Standing Grooming Details (indicate cue type and reason): Use of RW for support     Lower Body Bathing: Sitting/lateral leans;Minimal assistance Lower Body Bathing Details (indicate cue type and reason): PRN Min A Upper Body Dressing : Modified independent;Sitting   Lower Body Dressing: Sit to/from stand;Maximal assistance Lower Body Dressing Details (indicate cue type and reason): PRN Min A Toilet Transfer: Supervision/safety;RW;Ambulation;BSC           Functional mobility during ADLs: Min guard;Rolling walker;Cueing for safety General ADL Comments: Pt eager and quick to stand. Needed VC as reminder to slow down during recovery.      Vision Baseline Vision/History: Wears glasses Wears Glasses: Reading only Patient Visual Report: No change from baseline       Perception     Praxis      Pertinent Vitals/Pain Pain Assessment: 0-10 Pain Score: 8  Pain Location: R knee Pain Descriptors / Indicators: Aching Pain Intervention(s): Limited activity within patient's tolerance;Monitored during session;Repositioned;Ice applied  Hand Dominance Right   Extremity/Trunk Assessment Upper Extremity Assessment Upper Extremity Assessment: Overall WFL for tasks assessed   Lower Extremity Assessment Lower Extremity Assessment: Defer to PT evaluation       Communication Communication Communication: No difficulties   Cognition  Arousal/Alertness: Awake/alert Behavior During Therapy: WFL for tasks assessed/performed Overall Cognitive Status: Within Functional Limits for tasks assessed                                     General Comments       Exercises  Other Exercises: Pt educated in falls prevention strategies including footwear, removel of throw rugs and pet care mgt.  Other Exercises: Pt educated in polar care and compression sock mgt including donning/doffing, wear schedule and positioning Other Exercises: Pt educated in use of AE for LB dressing tasks.   Shoulder Instructions      Home Living Family/patient expects to be discharged to:: Private residence Living Arrangements: Alone Available Help at Discharge: Family Type of Home: House Home Access: Stairs to enter Secretary/administrator of Steps: 3 Entrance Stairs-Rails: Right;Left Home Layout: One level     Bathroom Shower/Tub: Tub/shower unit;Door(will remove door)   Bathroom Toilet: Standard Bathroom Accessibility: Yes   Home Equipment: Bedside commode;Walker - 2 wheels;Cane - single point;Hand held shower head;Grab bars - tub/shower          Prior Functioning/Environment          Comments: Indep with ADls, household and community distances; + driving; enjoys gardening and working outside.        OT Problem List: Decreased strength;Pain;Decreased range of motion;Decreased safety awareness;Decreased knowledge of use of DME or AE;Impaired balance (sitting and/or standing)      OT Treatment/Interventions: Self-care/ADL training;Therapeutic exercise;Patient/family education;DME and/or AE instruction;Therapeutic activities    OT Goals(Current goals can be found in the care plan section) Acute Rehab OT Goals Patient Stated Goal: to get back home and get back to my yardwork Time For Goal Achievement: 03/02/18 Potential to Achieve Goals: Good ADL Goals Pt Will Perform Lower Body Dressing: with supervision;with adaptive  equipment;sit to/from stand Additional ADL Goal #1: Pt will independently educate caregiver in compression sock mgt including donning/doffing, wear schedule and positioning. Additional ADL Goal #2: Pt will independently educate caregiver in polar care mgt including donning/doffing, wear schedule and positioning. Additional ADL Goal #3: Pt will independently utilize AE for LB dressing tasks.  OT Frequency: Min 1X/week   Barriers to D/C:            Co-evaluation              AM-PAC PT "6 Clicks" Daily Activity     Outcome Measure Help from another person eating meals?: None Help from another person taking care of personal grooming?: None Help from another person toileting, which includes using toliet, bedpan, or urinal?: A Little Help from another person bathing (including washing, rinsing, drying)?: A Little Help from another person to put on and taking off regular upper body clothing?: None Help from another person to put on and taking off regular lower body clothing?: A Little 6 Click Score: 21   End of Session Equipment Utilized During Treatment: Gait belt;Rolling walker  Activity Tolerance: Patient tolerated treatment well Patient left: in chair;with call bell/phone within reach;with chair alarm set;with SCD's reapplied  OT Visit Diagnosis: Other abnormalities of gait and mobility (R26.89);Pain Pain - Right/Left: Right Pain - part  of body: Knee                Time: 0920-0949 OT Time Calculation (min): 29 min Charges:     Gypsy Balsam OTS   02/16/2018, 11:19 AM

## 2018-02-16 NOTE — Progress Notes (Signed)
   Subjective: 1 Day Post-Op Procedure(s) (LRB): TOTAL KNEE ARTHROPLASTY (Right) Patient reports pain as 1 on 0-10 scale.   Patient is well, and has had no acute complaints or problems Denies any CP, SOB, ABD pain. We will continue therapy today.  Plan is to go Home after hospital stay.  Objective: Vital signs in last 24 hours: Temp:  [96 F (35.6 C)-98.1 F (36.7 C)] 97.9 F (36.6 C) (11/06 0737) Pulse Rate:  [53-81] 70 (11/06 0737) Resp:  [8-21] 19 (11/06 0424) BP: (101-150)/(57-89) 143/79 (11/06 0737) SpO2:  [92 %-100 %] 97 % (11/06 0737)  Intake/Output from previous day: 11/05 0701 - 11/06 0700 In: 2694.6 [P.O.:840; I.V.:1804.6; IV Piggyback:50] Out: 1225 [Urine:1200; Blood:25] Intake/Output this shift: No intake/output data recorded.  Recent Labs    02/16/18 0450  HGB 13.1   Recent Labs    02/16/18 0450  WBC 11.1*  RBC 4.41  HCT 39.9  PLT 409*   Recent Labs    02/16/18 0450  NA 139  K 3.7  CL 104  CO2 28  BUN 12  CREATININE 0.46  GLUCOSE 115*  CALCIUM 9.0   No results for input(s): LABPT, INR in the last 72 hours.  EXAM General - Patient is Alert, Appropriate and Oriented Extremity - Neurovascular intact Sensation intact distally Intact pulses distally Dorsiflexion/Plantar flexion intact No cellulitis present Compartment soft Dressing - dressing C/D/I and no drainage, wound VAC intact with no drainage Motor Function - intact, moving foot and toes well on exam.   Past Medical History:  Diagnosis Date  . Arthritis    back, hips, knees  . Degenerative disc disease, lumbar    L5  . Depression Sep 06, 2015   situational death of parents  . Pneumonia 2010-09-06    Assessment/Plan:   1 Day Post-Op Procedure(s) (LRB): TOTAL KNEE ARTHROPLASTY (Right) Active Problems:   S/P TKR (total knee replacement) using cement, right  Estimated body mass index is 27.74 kg/m as calculated from the following:   Height as of 02/03/18: 5\' 5"  (1.651 m).   Weight as  of 02/03/18: 75.6 kg. Advance diet Up with therapy  Needs bowel movement Labs are stable Vital signs are stable Care management to assist to discharge with home with home health PT DVT Prophylaxis - Aspirin, TED hose and SCDs Weight-Bearing as tolerated to right leg   T. Cranston Neighbor, PA-C [facility] 02/16/2018, 8:01 AM

## 2018-02-16 NOTE — Progress Notes (Signed)
Physical Therapy Treatment Patient Details Name: Belinda Mitchell MRN: [id number] DOB: Aug 27, 1955 Today's Date: 02/16/2018    History of Present Illness admitted for acute hospitalization status post R TKR, 02/15/18, WBAT.    PT Comments    Pt agreeable and eager for PT; pt wishes to practice steps. Reports 4/10 pain R knee. PT progressing very well ultimately with all mobility after 1 episode of "popping type pain" in R knee while ambulating. Pt holds knee in a crouched position (does not fall), but wishes to continue without resting first. No further episodes and able to demonstrate stair climbing and improved ambulation. Range in R knee is well beyond expected range at this time post surgery 107-110 degrees flexion, but encouraged to work on quad strengthening and knee extension. Continue PT to improve functional mobility safely to allow for an optimal, safe return home.    Follow Up Recommendations  Home health PT     Equipment Recommendations       Recommendations for Other Services       Precautions / Restrictions Precautions Precautions: Fall Restrictions Weight Bearing Restrictions: Yes RLE Weight Bearing: Weight bearing as tolerated    Mobility  Bed Mobility Overal bed mobility: Modified Independent                Transfers Overall transfer level: Modified independent Equipment used: Rolling walker (2 wheeled) Transfers: Sit to/from Stand Sit to Stand: Modified independent (Device/Increase time)         General transfer comment: From bed and commode   Ambulation/Gait Ambulation/Gait assistance: Min guard;Supervision Gait Distance (Feet): 370 Feet Assistive device: Rolling walker (2 wheeled) Gait Pattern/deviations: Step-through pattern   Gait velocity interpretation: 1.31 - 2.62 ft/sec, indicative of limited community ambulator General Gait Details: initially 3 point gait pattern with partial step through. Pt had 1 episode about 40 ft into walk of  buckling with painful yell and grabbing of R knee stating "something" popped. Pt denied need to sit or return to chair/bed, but wished to keep going. Tried to encourage pt to slow gait with improved reciprocal pattern. Pt having some frustration with instruction on rw position and step lengths. Post stair training pt begins ambulating reciprocally with fluid cadence, good speed and no further difficulties.    Stairs Stairs: Yes Stairs assistance: Min guard Stair Management: One rail Right;Forwards(but uses both hands on 1 rail) Number of Stairs: 6     Wheelchair Mobility    Modified Rankin (Stroke Patients Only)       Balance Overall balance assessment: Needs assistance Sitting-balance support: No upper extremity supported;Feet supported Sitting balance-Leahy Scale: Good     Standing balance support: Bilateral upper extremity supported Standing balance-Leahy Scale: Fair                              Cognition Arousal/Alertness: Awake/alert Behavior During Therapy: WFL for tasks assessed/performed Overall Cognitive Status: Within Functional Limits for tasks assessed                                        Exercises Total Joint Exercises Quad Sets: Strengthening;Both;20 reps Knee Flexion: AROM;Right;15 reps;Seated Goniometric ROM: 3-107    General Comments        Pertinent Vitals/Pain Pain Assessment: 0-10 Pain Score: 4  Pain Location: R knee Pain Descriptors / Indicators: Sore Pain Intervention(s): Premedicated before session;Monitored  during session;Ice applied    Home Living                      Prior Function            PT Goals (current goals can now be found in the care plan section) Progress towards PT goals: Progressing toward goals    Frequency    BID      PT Plan Current plan remains appropriate    Co-evaluation              AM-PAC PT "6 Clicks" Daily Activity  Outcome Measure  Difficulty  turning over in bed (including adjusting bedclothes, sheets and blankets)?: None Difficulty moving from lying on back to sitting on the side of the bed? : None Difficulty sitting down on and standing up from a chair with arms (e.g., wheelchair, bedside commode, etc,.)?: None Help needed moving to and from a bed to chair (including a wheelchair)?: A Little Help needed walking in hospital room?: A Little Help needed climbing 3-5 steps with a railing? : A Little 6 Click Score: 21    End of Session Equipment Utilized During Treatment: Gait belt Activity Tolerance: Patient tolerated treatment well Patient left: in bed;with call bell/phone within reach;with SCD's reapplied;Other (comment)(polar care and refused bed alarm)   PT Visit Diagnosis: Muscle weakness (generalized) (M62.81);Difficulty in walking, not elsewhere classified (R26.2);Pain Pain - Right/Left: Right Pain - part of body: Knee     Time: 1351-1431 PT Time Calculation (min) (ACUTE ONLY): 40 min  Charges:  $Gait Training: 23-37 mins $Therapeutic Exercise: 8-22 mins                      Scot Dock, PTA 02/16/2018, 4:43 PM

## 2018-02-17 MED ORDER — DOCUSATE SODIUM 100 MG PO CAPS: 100.0000 mg | ORAL_CAPSULE | Freq: Two times a day (BID) | ORAL | 0 refills | Status: DC

## 2018-02-17 MED ORDER — ACETAMINOPHEN 500 MG PO TABS: 500.0000 mg | ORAL_TABLET | Freq: Four times a day (QID) | ORAL | Status: AC | PRN

## 2018-02-17 MED ORDER — ASPIRIN 81 MG PO CHEW: 81.0000 mg | CHEWABLE_TABLET | Freq: Two times a day (BID) | ORAL | 0 refills | Status: AC

## 2018-02-17 MED ORDER — OXYCODONE HCL 5 MG PO TABS: 5.0000 mg | ORAL_TABLET | ORAL | 0 refills | Status: DC | PRN

## 2018-02-17 NOTE — Progress Notes (Signed)
Physical Therapy Treatment Patient Details Name: Belinda Mitchell MRN: [id number] DOB: February 03, 1956 Today's Date: 02/17/2018    History of Present Illness admitted for acute hospitalization status post R TKR, 02/15/18, WBAT.    PT Comments    Participated in exercises as described below.  Pt stood and was able to ambulate around unit with ease.  Stated she did stairs and felt comfortable with them yesterday.  Pt voices no c/o concerns regarding mobility.  No increased pain today or knee "popping" as yesterday.   Follow Up Recommendations  Home health PT     Equipment Recommendations       Recommendations for Other Services       Precautions / Restrictions Precautions Precautions: Fall Restrictions Weight Bearing Restrictions: Yes RLE Weight Bearing: Weight bearing as tolerated    Mobility  Bed Mobility Overal bed mobility: Modified Independent Bed Mobility: Supine to Sit     Supine to sit: Supervision        Transfers Overall transfer level: Modified independent Equipment used: Rolling walker (2 wheeled) Transfers: Sit to/from Stand Sit to Stand: Modified independent (Device/Increase time)            Ambulation/Gait Ambulation/Gait assistance: Min guard;Supervision Gait Distance (Feet): 180 Feet Assistive device: Rolling walker (2 wheeled) Gait Pattern/deviations: Step-through pattern     General Gait Details: generally steady.  Reports being comfortable with stairs.   Stairs             Wheelchair Mobility    Modified Rankin (Stroke Patients Only)       Balance Overall balance assessment: Needs assistance Sitting-balance support: No upper extremity supported;Feet supported Sitting balance-Leahy Scale: Good     Standing balance support: Bilateral upper extremity supported Standing balance-Leahy Scale: Fair                              Cognition Arousal/Alertness: Awake/alert Behavior During Therapy: WFL for tasks  assessed/performed Overall Cognitive Status: Within Functional Limits for tasks assessed                                        Exercises Total Joint Exercises Goniometric ROM: 0-110 - limtied due to pain and some inc edema in quad. Other Exercises Other Exercises: LAQ and knee flexion in sitting, SLR, heel slides, quad sets and ankle pumps in supine x 10    General Comments        Pertinent Vitals/Pain Pain Assessment: 0-10 Pain Score: 4  Pain Location: R knee Pain Descriptors / Indicators: Sore Pain Intervention(s): Monitored during session;Premedicated before session;Limited activity within patient's tolerance;Ice applied    Home Living                      Prior Function            PT Goals (current goals can now be found in the care plan section) Progress towards PT goals: Progressing toward goals    Frequency    BID      PT Plan Current plan remains appropriate    Co-evaluation              AM-PAC PT "6 Clicks" Daily Activity  Outcome Measure  Difficulty turning over in bed (including adjusting bedclothes, sheets and blankets)?: None Difficulty moving from lying on back to sitting on the side of the  bed? : None Difficulty sitting down on and standing up from a chair with arms (e.g., wheelchair, bedside commode, etc,.)?: None Help needed moving to and from a bed to chair (including a wheelchair)?: None Help needed walking in hospital room?: A Little Help needed climbing 3-5 steps with a railing? : A Little 6 Click Score: 22    End of Session Equipment Utilized During Treatment: Gait belt Activity Tolerance: Patient tolerated treatment well Patient left: in bed;with call bell/phone within reach   Pain - Right/Left: Right Pain - part of body: Knee     Time: 0927-0939 PT Time Calculation (min) (ACUTE ONLY): 12 min  Charges:  $Gait Training: 8-22 mins                     Danielle Dess, PTA 02/17/18, 10:54 AM

## 2018-02-17 NOTE — Progress Notes (Signed)
Pt ready for discharge home today per MD. Patient assessment unchanged from this morning. Reviewed discharge instructions and prescriptions with pt; all questions answered and pt verbalized understanding. PIV removed, VSS. Pt assisted to car via NT.   Belinda Mitchell  

## 2018-02-17 NOTE — Care Management Note (Signed)
Case Management Note  Patient Details  Name: Belinda Mitchell MRN: [id number] Date of Birth: [birth year]  Subjective/Objective:   Met with patient at bedside to discuss discharge planning. Patient lives alone. She has family that will be assist her. She prefers [facility]. She has a walker and elevated toilet seat. ASA for anticoagulation. Discharging today                   Action/Plan:   Expected Discharge Date:  02/17/18               Expected Discharge Plan:  [place]  In-House Referral:     Discharge planning Services  CM Consult  Post Acute Care Choice:  Home Health Choice offered to:  Patient  DME Arranged:    DME Agency:     HH Arranged:  PT [place]:  [facility] (formerly [employer])  Status of Service:  Completed, signed off  If discussed at [employer], dates discussed:    Additional Comments:  Jolly Mango, RN 02/17/2018, 10:29 AM

## 2018-02-17 NOTE — Discharge Instructions (Signed)

## 2018-02-17 NOTE — Progress Notes (Signed)
   Subjective: 2 Days Post-Op Procedure(s) (LRB): TOTAL KNEE ARTHROPLASTY (Right) Patient reports pain as 1 on 0-10 scale.   Patient is well, and has had no acute complaints or problems Denies any CP, SOB, ABD pain. We will continue therapy today.  Plan is to go Home after hospital stay.  Objective: Vital signs in last 24 hours: Temp:  [97.5 F (36.4 C)-98.5 F (36.9 C)] 98.3 F (36.8 C) (11/07 0720) Pulse Rate:  [74-87] 87 (11/07 0720) Resp:  [18-19] 18 (11/07 0720) BP: (116-134)/(67-86) 116/67 (11/07 0720) SpO2:  [94 %-98 %] 94 % (11/07 0720)  Intake/Output from previous day: 11/06 0701 - 11/07 0700 In: 1200 [P.O.:1200] Out: 900 [Urine:900] Intake/Output this shift: No intake/output data recorded.  Recent Labs    02/16/18 0450  HGB 13.1   Recent Labs    02/16/18 0450  WBC 11.1*  RBC 4.41  HCT 39.9  PLT 409*   Recent Labs    02/16/18 0450  NA 139  K 3.7  CL 104  CO2 28  BUN 12  CREATININE 0.46  GLUCOSE 115*  CALCIUM 9.0   No results for input(s): LABPT, INR in the last 72 hours.  EXAM General - Patient is Alert, Appropriate and Oriented Extremity - Neurovascular intact Sensation intact distally Intact pulses distally Dorsiflexion/Plantar flexion intact No cellulitis present Compartment soft Dressing - dressing C/D/I and no drainage, wound VAC intact with no drainage Motor Function - intact, moving foot and toes well on exam.   Past Medical History:  Diagnosis Date  . Arthritis    back, hips, knees  . Degenerative disc disease, lumbar    L5  . Depression 2015/09/07   situational death of parents  . Pneumonia 09/07/2010    Assessment/Plan:   2 Days Post-Op Procedure(s) (LRB): TOTAL KNEE ARTHROPLASTY (Right) Active Problems:   S/P TKR (total knee replacement) using cement, right  Estimated body mass index is 27.74 kg/m as calculated from the following:   Height as of 02/03/18: 5\' 5"  (1.651 m).   Weight as of 02/03/18: 75.6 kg. Advance diet Up  with therapy  Vital signs are stable Care management to assist to discharge with home with home health PT today DVT Prophylaxis - Aspirin, TED hose and SCDs Weight-Bearing as tolerated to right leg   T. Cranston Neighbor, PA-C [facility] 02/17/2018, 9:24 AM

## 2018-02-17 NOTE — Discharge Summary (Signed)
Physician Discharge Summary  Patient ID: Belinda Mitchell MRN: [id number] DOB/AGE: 62-Aug-1957 62 y.o.  Admit date: 02/15/2018 Discharge date: 02/17/2018  Admission Diagnoses:  RIGHT KNEE PAIN   Discharge Diagnoses: Patient Active Problem List   Diagnosis Date Noted  . S/P TKR (total knee replacement) using cement, right 02/15/2018  . Primary osteoarthritis of left knee 04/07/2016    Past Medical History:  Diagnosis Date  . Arthritis    back, hips, knees  . Degenerative disc disease, lumbar    L5  . Depression 09-03-2015   situational death of parents  . Pneumonia 09-03-10     Transfusion: none   Consultants (if any):   Discharged Condition: Improved  Hospital Course: Belinda Mitchell is an 62 y.o. female who was admitted 02/15/2018 with a diagnosis of right knee osteoarthritis and went to the operating room on 02/15/2018 and underwent the above named procedures.    Surgeries: Procedure(s): TOTAL KNEE ARTHROPLASTY on 02/15/2018 Patient tolerated the surgery well. Taken to PACU where she was stabilized and then transferred to the orthopedic floor.  Started on Aspirin, TEDs, SCDs. Heels elevated on bed with rolled towels. No evidence of DVT. Negative [place]. Physical therapy started on day #1 for gait training and transfer. OT started day #1 for ADL and assisted devices.  Patient's foley was d/c on day #1. Patient's IV was d/c on day #2.  On post op day #2 patient was stable and ready for discharge to home with HHPT.  Implants: Medacta 3 GMK PS, 17 mm PS tibial insert with 2 tibia and short stem, 2 patella all components cemented  She was given perioperative antibiotics:  Anti-infectives (From admission, onward)   Start     Dose/Rate Route Frequency Ordered Stop   02/15/18 1400  ceFAZolin (ANCEF) IVPB 1 g/50 mL premix     1 g 100 mL/hr over 30 Minutes Intravenous Every 6 hours 02/15/18 1016 02/15/18 2216   02/15/18 0612  ceFAZolin (ANCEF) 2-4 GM/100ML-% IVPB    Note to Pharmacy:   Manfred Arch   : cabinet override      02/15/18 0612 02/15/18 1829   02/14/18 09-03-43  ceFAZolin (ANCEF) IVPB 2g/100 mL premix     2 g 200 mL/hr over 30 Minutes Intravenous  Once 02/14/18 09-03-39 02/15/18 0720    .  She was given sequential compression devices, early ambulation, and Aspirin, TEDs for DVT prophylaxis.  She benefited maximally from the hospital stay and there were no complications.    Recent vital signs:  Vitals:   02/17/18 0021 02/17/18 0720  BP: 126/70 116/67  Pulse: 80 87  Resp: 19 18  Temp: 98.5 F (36.9 C) 98.3 F (36.8 C)  SpO2: 97% 94%    Recent laboratory studies:  Lab Results  Component Value Date   HGB 13.1 02/16/2018   HGB 15.5 (H) 02/03/2018   HGB 11.9 (L) 04/08/2016   Lab Results  Component Value Date   WBC 11.1 (H) 02/16/2018   PLT 409 (H) 02/16/2018   Lab Results  Component Value Date   INR 0.87 02/03/2018   Lab Results  Component Value Date   NA 139 02/16/2018   K 3.7 02/16/2018   CL 104 02/16/2018   CO2 28 02/16/2018   BUN 12 02/16/2018   CREATININE 0.46 02/16/2018   GLUCOSE 115 (H) 02/16/2018    Discharge Medications:   Allergies as of 02/17/2018   No Known Allergies     Medication List    STOP taking these medications  enoxaparin 40 MG/0.4ML injection Commonly known as:  LOVENOX     TAKE these medications   acetaminophen 500 MG tablet Commonly known as:  TYLENOL Take 1-2 tablets (500-1,000 mg total) by mouth every 6 (six) hours as needed for mild pain (pain score 1-3 or temp > 100.5).   aspirin 81 MG chewable tablet Chew 1 tablet (81 mg total) by mouth 2 (two) times daily.   cetirizine 10 MG tablet Commonly known as:  ZYRTEC Take 10 mg by mouth daily.   cyclobenzaprine 10 MG tablet Commonly known as:  FLEXERIL Take 10 mg by mouth at bedtime as needed for muscle spasms.   docusate sodium 100 MG capsule Commonly known as:  COLACE Take 1 capsule (100 mg total) by mouth 2 (two) times daily.    hydrochlorothiazide 25 MG tablet Commonly known as:  HYDRODIURIL Take 25 mg by mouth daily as needed (for fluid).   oxyCODONE 5 MG immediate release tablet Commonly known as:  Oxy IR/ROXICODONE Take 1-2 tablets (5-10 mg total) by mouth every 4 (four) hours as needed for moderate pain (pain score 4-6). What changed:    when to take this  reasons to take this            Durable Medical Equipment  (From admission, onward)         Start     Ordered   02/15/18 1017  DME Walker rolling  Once    Question:  Patient needs a walker to treat with the following condition  Answer:  S/P TKR (total knee replacement) using cement, right   02/15/18 1016   02/15/18 1017  DME 3 n 1  Once     02/15/18 1016   02/15/18 1017  DME Bedside commode  Once    Question:  Patient needs a bedside commode to treat with the following condition  Answer:  S/P TKR (total knee replacement) using cement, right   02/15/18 1016          Diagnostic Studies: Dg Knee 1-2 Views Right  Result Date: 02/15/2018 CLINICAL DATA:  Postop right knee replacement EXAM: RIGHT KNEE - 1-2 VIEW COMPARISON:  CT right knee of 01/20/2018 FINDINGS: The femoral and tibial components of the right total knee replacement appear to be in good position with no complicating features. Some air is noted in the joint space and superficial soft tissues postoperatively. IMPRESSION: Right total knee replacement components in good position with no complicating features. Electronically Signed   By: Dwyane Dee M.D.   On: 02/15/2018 09:39   Ct Knee Right Wo Contrast  Result Date: 01/20/2018 CLINICAL DATA:  Preop for knee replacement surgery. MY Knee protocol EXAM: CT OF THE right KNEE WITHOUT CONTRAST TECHNIQUE: Multidetector CT imaging of the right knee was performed according to the standard protocol. Multiplanar CT image reconstructions were also generated. COMPARISON:  None. FINDINGS: Right hip: Axial images of the right hip demonstrate  moderate to advanced hip joint degenerative changes with spurring and subchondral cystic change. Evidence of prior hardware removal, likely cannulated hip screws related to a prior femoral neck fracture. No findings for avascular necrosis. Right knee: Significant medial and lateral compartment degenerative changes with joint space narrowing, spurring, bony eburnation and subchondral cystic change. This is most significant in the lateral compartment. Moderate patellofemoral joint degenerative changes. No knee joint effusion. Right ankle: Axial images demonstrate minimal degenerative changes. No bone lesion or osteochondral abnormality. IMPRESSION: 1. Advanced right knee joint degenerative changes as detailed above. 2. Moderate  to advanced right hip joint degenerative changes and evidence of prior fracture and fixation hardware with subsequent removal. Electronically Signed   By: Rudie Meyer M.D.   On: 01/20/2018 13:35   Mm 3d Screen Breast Bilateral  Result Date: 02/14/2018 CLINICAL DATA:  Screening. EXAM: DIGITAL SCREENING BILATERAL MAMMOGRAM WITH TOMO AND CAD COMPARISON:  Previous exam(s). ACR Breast Density Category a: The breast tissue is almost entirely fatty. FINDINGS: There are no findings suspicious for malignancy. Images were processed with CAD. IMPRESSION: No mammographic evidence of malignancy. A result letter of this screening mammogram will be mailed directly to the patient. RECOMMENDATION: Screening mammogram in one year. (Code:[id number]-B-01Y) BI-RADS CATEGORY  1: Negative. Electronically Signed   By: Britta Mccreedy M.D.   On: 02/14/2018 14:52    Disposition: Discharge disposition: 01-Home or Self Care         Follow-up Information    Evon Slack, PA-C Follow up.   Specialties:  Orthopedic Surgery, Emergency Medicine Contact information: [address] [place] [address] [phone]            Signed: Patience Musca 02/17/2018, 9:33 AM

## 2018-02-22 NOTE — Anesthesia Postprocedure Evaluation (Signed)
Anesthesia Post Note  Patient: Belinda Mitchell  Procedure(s) Performed: TOTAL KNEE ARTHROPLASTY (Right Knee)  Patient location during evaluation: PACU Anesthesia Type: General Level of consciousness: awake and alert and oriented Pain management: pain level controlled Vital Signs Assessment: post-procedure vital signs reviewed and stable Respiratory status: spontaneous breathing Cardiovascular status: blood pressure returned to baseline Anesthetic complications: no     Last Vitals:  Vitals:   02/17/18 0720 02/17/18 1045  BP: 116/67 133/74  Pulse: 87 86  Resp: 18 18  Temp: 36.8 C 36.9 C  SpO2: 94% 99%    Last Pain:  Vitals:   02/17/18 1045  TempSrc: Oral  PainSc:                  Elleah Hemsley

## 2018-02-23 ENCOUNTER — Telehealth: Payer: Self-pay

## 2018-02-23 NOTE — Telephone Encounter (Signed)
EMMI Follow-up: Noted on the report that the patient had an unfilled Rx. I talked with Ms. Jagoda and that Rx has now been picked up.  Wanted to let us know the service and staff here were wonderful during her stay.  The aide that helped her get cleaned up each day was exceptional.  Thanked me for calling. No other needs noted.

## 2018-08-31 ENCOUNTER — Other Ambulatory Visit: Payer: Self-pay | Admitting: Physician Assistant

## 2018-08-31 ENCOUNTER — Ambulatory Visit
Admission: RE | Admit: 2018-08-31 | Discharge: 2018-08-31 | Disposition: A | Discharge status: Home / Self Care | Payer: BLUE CROSS/BLUE SHIELD | Source: Ambulatory Visit | Attending: Physician Assistant | Admitting: Physician Assistant

## 2018-08-31 ENCOUNTER — Other Ambulatory Visit: Payer: Self-pay

## 2018-08-31 DIAGNOSIS — M7989 Other specified soft tissue disorders: Secondary | ICD-10-CM | POA: Diagnosis not present

## 2018-12-29 ENCOUNTER — Other Ambulatory Visit: Payer: Self-pay | Admitting: Family Medicine

## 2018-12-29 DIAGNOSIS — Z1231 Encounter for screening mammogram for malignant neoplasm of breast: Secondary | ICD-10-CM

## 2019-07-10 ENCOUNTER — Ambulatory Visit: Payer: BLUE CROSS/BLUE SHIELD | Attending: Internal Medicine

## 2019-07-10 DIAGNOSIS — Z23 Encounter for immunization: Secondary | ICD-10-CM

## 2019-07-10 NOTE — Progress Notes (Signed)
   Covid-19 Vaccination Clinic  Name:  Belinda Mitchell    MRN: [id number] DOB: [birth year]  07/10/2019  Ms. Belinda Mitchell was observed post Covid-19 immunization for 15 minutes without incident. She was provided with Vaccine Information Sheet and instruction to access the V-Safe system.   Ms. Belinda Mitchell was instructed to call 911 with any severe reactions post vaccine: Marland Kitchen Difficulty breathing  . Swelling of face and throat  . A fast heartbeat  . A bad rash all over body  . Dizziness and weakness   Immunizations Administered    Name Date Dose VIS Date Route   Pfizer COVID-19 Vaccine 07/10/2019  9:26 AM 0.3 mL 03/24/2019 Intramuscular   Manufacturer: [employer], [employer]   Lot: [id number]   NDC: [address]-8

## 2019-07-31 ENCOUNTER — Ambulatory Visit: Payer: BLUE CROSS/BLUE SHIELD | Attending: Internal Medicine

## 2019-07-31 DIAGNOSIS — Z23 Encounter for immunization: Secondary | ICD-10-CM

## 2019-07-31 NOTE — Progress Notes (Signed)
   Covid-19 Vaccination Clinic  Name:  Kisa Fujii    MRN: [id number] DOB: May 17, 1955  07/31/2019  Ms. Drawdy was observed post Covid-19 immunization for 15 minutes without incident. She was provided with Vaccine Information Sheet and instruction to access the V-Safe system.   Ms. Ambrosius was instructed to call 911 with any severe reactions post vaccine: Marland Kitchen Difficulty breathing  . Swelling of face and throat  . A fast heartbeat  . A bad rash all over body  . Dizziness and weakness   Immunizations Administered    Name Date Dose VIS Date Route   Pfizer COVID-19 Vaccine 07/31/2019  8:55 AM 0.3 mL 06/07/2018 Intramuscular   Manufacturer: [employer], [employer]   Lot: [id number]   NDC: [address]-2

## 2020-06-12 ENCOUNTER — Other Ambulatory Visit: Payer: Self-pay | Admitting: Orthopedic Surgery

## 2020-06-12 DIAGNOSIS — M545 Low back pain, unspecified: Secondary | ICD-10-CM

## 2020-06-12 DIAGNOSIS — M4807 Spinal stenosis, lumbosacral region: Secondary | ICD-10-CM

## 2020-06-23 ENCOUNTER — Ambulatory Visit
Admission: RE | Admit: 2020-06-23 | Discharge: 2020-06-23 | Disposition: A | Discharge status: Home / Self Care | Payer: Medicare HMO | Source: Ambulatory Visit | Attending: Orthopedic Surgery | Admitting: Orthopedic Surgery

## 2020-06-23 ENCOUNTER — Other Ambulatory Visit: Payer: Self-pay

## 2020-06-23 DIAGNOSIS — M545 Low back pain, unspecified: Secondary | ICD-10-CM | POA: Diagnosis present

## 2020-06-23 DIAGNOSIS — M4807 Spinal stenosis, lumbosacral region: Secondary | ICD-10-CM

## 2020-07-17 ENCOUNTER — Other Ambulatory Visit: Payer: Self-pay | Admitting: Physician Assistant

## 2020-07-17 DIAGNOSIS — Z1231 Encounter for screening mammogram for malignant neoplasm of breast: Secondary | ICD-10-CM

## 2020-07-25 ENCOUNTER — Ambulatory Visit
Admission: RE | Admit: 2020-07-25 | Discharge: 2020-07-25 | Disposition: A | Discharge status: Home / Self Care | Payer: Medicare HMO | Source: Ambulatory Visit | Attending: Physician Assistant | Admitting: Physician Assistant

## 2020-07-25 ENCOUNTER — Other Ambulatory Visit: Payer: Self-pay

## 2020-07-25 DIAGNOSIS — Z1231 Encounter for screening mammogram for malignant neoplasm of breast: Secondary | ICD-10-CM | POA: Diagnosis present

## 2020-08-02 ENCOUNTER — Other Ambulatory Visit: Payer: Self-pay | Admitting: Orthopedic Surgery

## 2020-08-19 ENCOUNTER — Other Ambulatory Visit: Payer: Self-pay

## 2020-08-19 ENCOUNTER — Other Ambulatory Visit
Admission: RE | Admit: 2020-08-19 | Discharge: 2020-08-19 | Disposition: A | Discharge status: Home / Self Care | Payer: Medicare HMO | Source: Ambulatory Visit | Attending: Orthopedic Surgery | Admitting: Orthopedic Surgery

## 2020-08-19 DIAGNOSIS — Z01812 Encounter for preprocedural laboratory examination: Secondary | ICD-10-CM | POA: Insufficient documentation

## 2020-08-19 HISTORY — DX: Spinal stenosis, site unspecified: M48.00

## 2020-08-19 HISTORY — DX: Essential (primary) hypertension: I10

## 2020-08-19 HISTORY — DX: Atrial premature depolarization: I49.1

## 2020-08-19 HISTORY — DX: Ventricular premature depolarization: I49.3

## 2020-08-19 LAB — URINALYSIS, ROUTINE W REFLEX MICROSCOPIC
Bilirubin Urine: NEGATIVE
Glucose, UA: NEGATIVE mg/dL
Hgb urine dipstick: NEGATIVE
Ketones, ur: NEGATIVE mg/dL
Leukocytes,Ua: NEGATIVE
Nitrite: NEGATIVE
Protein, ur: NEGATIVE mg/dL
Specific Gravity, Urine: 1.008 (ref 1.005–1.030)
pH: 7 (ref 5.0–8.0)

## 2020-08-19 LAB — COMPREHENSIVE METABOLIC PANEL
ALT: 15 U/L (ref 0–44)
AST: 26 U/L (ref 15–41)
Albumin: 4.2 g/dL (ref 3.5–5.0)
Alkaline Phosphatase: 54 U/L (ref 38–126)
Anion gap: 8 (ref 5–15)
BUN: 8 mg/dL (ref 8–23)
CO2: 30 mmol/L (ref 22–32)
Calcium: 9.5 mg/dL (ref 8.9–10.3)
Chloride: 96 mmol/L — ABNORMAL LOW (ref 98–111)
Creatinine, Ser: 0.64 mg/dL (ref 0.44–1.00)
GFR, Estimated: >60 mL/min (ref >60)
Glucose, Bld: 97 mg/dL (ref 70–99)
Potassium: 3 mmol/L — ABNORMAL LOW (ref 3.5–5.1)
Sodium: 134 mmol/L — ABNORMAL LOW (ref 135–145)
Total Bilirubin: 0.5 mg/dL (ref 0.3–1.2)
Total Protein: 7.9 g/dL (ref 6.5–8.1)

## 2020-08-19 LAB — CBC WITH DIFFERENTIAL/PLATELET
Abs Immature Granulocytes: 0.02 10*3/uL (ref 0.00–0.07)
Basophils Absolute: 0 10*3/uL (ref 0.0–0.1)
Basophils Relative: 1 %
Eosinophils Absolute: 0.1 10*3/uL (ref 0.0–0.5)
Eosinophils Relative: 2 %
HCT: 45.3 % (ref 36.0–46.0)
Hemoglobin: 15.1 g/dL — ABNORMAL HIGH (ref 12.0–15.0)
Immature Granulocytes: 0 %
Lymphocytes Relative: 27 %
Lymphs Abs: 2.1 10*3/uL (ref 0.7–4.0)
MCH: 29.7 pg (ref 26.0–34.0)
MCHC: 33.3 g/dL (ref 30.0–36.0)
MCV: 89.2 fL (ref 80.0–100.0)
Monocytes Absolute: 0.6 10*3/uL (ref 0.1–1.0)
Monocytes Relative: 8 %
Neutro Abs: 5 10*3/uL (ref 1.7–7.7)
Neutrophils Relative %: 62 %
Platelets: 493 10*3/uL — ABNORMAL HIGH (ref 150–400)
RBC: 5.08 MIL/uL (ref 3.87–5.11)
RDW: 12.9 % (ref 11.5–15.5)
WBC: 7.9 10*3/uL (ref 4.0–10.5)
nRBC: 0 % (ref 0.0–0.2)

## 2020-08-19 LAB — TYPE AND SCREEN
ABO/RH(D): B NEG
Antibody Screen: NEGATIVE

## 2020-08-19 LAB — SURGICAL PCR SCREEN
MRSA, PCR: NEGATIVE
Staphylococcus aureus: NEGATIVE

## 2020-08-19 NOTE — Progress Notes (Signed)
  [facility] Perioperative Services: Pre-Admission/Anesthesia Testing  Abnormal Lab Notification   Date: 08/19/20  Name: Tika Hannis MRN:   [id number]  Re: Abnormal labs noted during PAT appointment   Provider(s) Notified: Kennedy Bucker, MD Notification mode: Routed and/or faxed via [facility]   ABNORMAL LAB VALUE(S): Lab Results  Component Value Date   K 3.0 (L) 08/19/2020   Notes:  Patient is scheduled for a total hip arthroplasty on 08/27/2020. In review of patient's EMR, it is noted that patient is on daily diuretic therapy (HCTZ 25 mg). Will send to attending surgeon for review and optimization. Order placed to have K+ recheck on the day of surgery.   This is a Personal assistant; no formal response is required.  Quentin Mulling, MSN, APRN, FNP-C, CEN [facility]  Peri-operative Services Nurse Practitioner Phone: [phone] Fax: [phone] 08/19/20 11:46 AM

## 2020-08-19 NOTE — Patient Instructions (Addendum)
Your procedure is scheduled on:08-27-20 TUESDAY Report to the Registration Desk on the 1st floor of the Medical Mall-Then proceed to the 2nd floor Surgery Desk in the Medical Mall To find out your arrival time, please call [phone] between 1PM - 3PM on:08-26-20 MONDAY  REMEMBER: Instructions that are not followed completely may result in serious medical risk, up to and including death; or upon the discretion of your surgeon and anesthesiologist your surgery may need to be rescheduled.  Do not eat food after midnight the night before surgery.  No gum chewing, lozengers or hard candies.  You may however, drink CLEAR liquids up to 2 hours before you are scheduled to arrive for your surgery. Do not drink anything within 2 hours of your scheduled arrival time.  Clear liquids include: - water  - apple juice without pulp - gatorade  - black coffee or tea (Do NOT add milk or creamers to the coffee or tea) Do NOT drink anything that is not on this list.  In addition, your doctor has ordered for you to drink the provided  Ensure Pre-Surgery Clear Carbohydrate Drink  Drinking this carbohydrate drink up to two hours before surgery helps to reduce insulin resistance and improve patient outcomes. Please complete drinking 2 hours prior to scheduled arrival time.  DO NOT TAKE ANY MEDICATION THE MORNING OF SURGERY  One week prior to surgery: Stop Anti-inflammatories (NSAIDS) such as Advil, Aleve, Ibuprofen, Motrin, Naproxen, Naprosyn and Aspirin based products such as Excedrin, Goodys Powder, BC Powder-OK TO TAKE TYLENOL IF NEEDED  Stop ANY OVER THE COUNTER supplements/vitamins until after surgery.  No Alcohol for 24 hours before or after surgery.  No Smoking including e-cigarettes for 24 hours prior to surgery.  No chewable tobacco products for at least 6 hours prior to surgery.  No nicotine patches on the day of surgery.  Do not use any "recreational" drugs for at least a week prior to your  surgery.  Please be advised that the combination of cocaine and anesthesia may have negative outcomes, up to and including death. If you test positive for cocaine, your surgery will be cancelled.  On the morning of surgery brush your teeth with toothpaste and water, you may rinse your mouth with mouthwash if you wish. Do not swallow any toothpaste or mouthwash.  Do not wear jewelry, make-up, hairpins, clips or nail polish.  Do not wear lotions, powders, or perfumes.   Do not shave body from the neck down 48 hours prior to surgery just in case you cut yourself which could leave a site for infection.  Also, freshly shaved skin may become irritated if using the CHG soap.  Contact lenses, hearing aids and dentures may not be worn into surgery.  Do not bring valuables to the hospital. [facility] is not responsible for any missing/lost belongings or valuables.   Use CHG Soap as directed on instruction sheet.  Notify your doctor if there is any change in your medical condition (cold, fever, infection).  Wear comfortable clothing (specific to your surgery type) to the hospital.  Plan for stool softeners for home use; pain medications have a tendency to cause constipation. You can also help prevent constipation by eating foods high in fiber such as fruits and vegetables and drinking plenty of fluids as your diet allows.  After surgery, you can help prevent lung complications by doing breathing exercises.  Take deep breaths and cough every 1-2 hours. Your doctor may order a device called an Facilities manager  to help you take deep breaths. When coughing or sneezing, hold a pillow firmly against your incision with both hands. This is called "splinting." Doing this helps protect your incision. It also decreases belly discomfort.  If you are being admitted to the hospital overnight, leave your suitcase in the car. After surgery it may be brought to your room.  If you are being discharged the  day of surgery, you will not be allowed to drive home. You will need a responsible adult (18 years or older) to drive you home and stay with you that night.   If you are taking public transportation, you will need to have a responsible adult (18 years or older) with you. Please confirm with your physician that it is acceptable to use public transportation.   Please call the Pre-admissions Testing Dept. at [phone] if you have any questions about these instructions.  Surgery Visitation Policy:  Patients undergoing a surgery or procedure may have one family member or support person with them as long as that person is not COVID-19 positive or experiencing its symptoms.  That person may remain in the waiting area during the procedure.  Inpatient Visitation:    Visiting hours are 7 a.m. to 8 p.m. Inpatients will be allowed two visitors daily. The visitors may change each day during the patient's stay. No visitors under the age of 39. Any visitor under the age of 48 must be accompanied by an adult. The visitor must pass COVID-19 screenings, use hand sanitizer when entering and exiting the patient's room and wear a mask at all times, including in the patient's room. Patients must also wear a mask when staff or their visitor are in the room. Masking is required regardless of vaccination status.

## 2020-08-19 NOTE — Pre-Procedure Instructions (Signed)
ECG 12-lead  Component 2 mo ago  Vent Rate (bpm)  63   PR Interval (msec)  178   QRS Interval (msec)  72   QT Interval (msec)  400   QTc (msec)  409   Resulting Agency [facility] GE MUSE RESULTS   Narrative Performed by Rolla [facility] GE MUSE RESULTS This result has an attachment that is not available.  Normal sinus rhythm  Possible Left atrial enlargement  Borderline ECG  No previous ECGs available  I reviewed and concur with this report. Electronically signed EX:[id number], MD, ALEX [phone]) on 06/20/2020 12:10:03 PM Specimen Collected: 06/18/20 2:26 PM Last Resulted: 06/18/20 2:26 PM  Received From: [facility]  Result Received: 07/17/20 8:53 AM

## 2020-08-21 ENCOUNTER — Encounter: Payer: Self-pay | Admitting: Orthopedic Surgery

## 2020-08-21 NOTE — Progress Notes (Signed)
Perioperative Services  Pre-Admission/Anesthesia Testing Clinical Review  Date: 08/21/20  Patient Demographics:  Name: Belinda Mitchell DOB:   03-Nov-1955 MRN:   [id number]  Planned Surgical Procedure(s):    Case: [id number] Date/Time: 08/27/20 0923   Procedure: TOTAL HIP ARTHROPLASTY ANTERIOR APPROACH (Right Hip)   Anesthesia type: Choice   Pre-op diagnosis: Primary osteoarthritis of right hip M16.11   Location: [facility] OR ROOM 01 / [facility]   Surgeons: Kennedy Bucker, MD    NOTE: Available PAT nursing documentation and vital signs have been reviewed. Clinical nursing staff has updated patient's PMH/PSHx, current medication list, and drug allergies/intolerances to ensure comprehensive history available to assist in medical decision making as it pertains to the aforementioned surgical procedure and anticipated anesthetic course.   Clinical Discussion:  Belinda Mitchell is a 65 y.o. female who is submitted for pre-surgical anesthesia review and clearance prior to her undergoing the above procedure. Patient is a Former Smoker (40 pack years; quit 05/2013). Pertinent PMH includes: palpitations, HTN, OA, lumbar DDD, spinal stenosis, depression.   Patient is followed by cardiology Darrold Junker, MD). She was last seen in the cardiology clinic on 07/10/2020; notes reviewed.  At the time of her clinic visit, patient doing well overall from a cardiovascular perspective.  She denied any chest pain, shortness of breath, PND, orthopnea, significant palpitations, peripheral edema, vertiginous symptoms, or presyncope/syncope.  Patient was a self-referral to cardiology for evaluation of palpitations and atypical chest pain.  At the time of her clinic visit, patient had not been seen by cardiologist since 2017.  Previous work-up has included:   Myocardial perfusion imaging study on 08/08/2015 that revealed normal left ventricular systolic function (LVEF 56%) with no evidence of significant  reversible ischemia.     Subsequent TTE performed on 08/22/2015 demonstrated normal LV function (LVEF >55%), mild LVH, and mild valvular insufficiency.     Long-term cardiac event monitor study performed in 06/2020 revealed a predominantly underlying sinus rhythm with a mean rate of 79 bpm; heart rate ranged from 46 to 174 bpm.  There were occasional PVCs and PACs noted, in addition to infrequent atrial runs with the longest lasting 6 beats (see full interpretation of cardiovascular testing below).   Palpitations felt to be related to stress and excessive ETOH intake as symptoms resolved as patient worked on these factors.  Given the resolution of her palpitations, treatment with beta-blocker therapy was deferred.  Patient on GDMT for her HTN diagnosis.  Blood pressure well controlled at 124/84 on diuretic monotherapy. Functional capacity, as defined by DASI, is documented as being >/= 4 METS.  No changes were made to her medication regimen.  Patient to follow-up with outpatient cardiology in 4 months or sooner if needed.  Patient is scheduled for an elective orthopedic procedure on 08/27/2020 with Dr. Kennedy Bucker.  Given patient's past medical history significant for cardiovascular diagnoses, presurgical cardiac clearance was sought by the PAT team. Per cardiology, "this patient is optimized for surgery and may proceed with the planned procedural course with a LOW risk stratification".  This patient is not on daily anticoagulation/antiplatelet therapy.  Patient denies previous perioperative complications with anesthesia in the past. In review of the available records, it is noted that patient underwent a general anesthetic course here (ASA II) in 02/2018 without documented complications.   Vitals with BMI 08/19/2020 02/17/2018 02/17/2018  Height 5\' 5"  - -  Weight 159 lbs 3 oz - -  BMI 26.49 - -  Systolic 126 133  Diastolic 84 74 67  Pulse 76 86 87    Providers/Specialists:   NOTE: Primary  physician provider listed below. Patient may have been seen by APP or partner within same practice.   PROVIDER ROLE / SPECIALTY LAST Inge RiseV  Menz, Michael, MD  Orthopedics (Surgeon)  06/12/2020  Santiago BurMoore, Elizabeth, PA  Primary Care Provider  ???  Marcina MillardParaschos, Alexander, MD  Cardiology  07/10/2020   Allergies:  Patient has no known allergies.  Current Home Medications:   No current facility-administered medications for this encounter.   . Aspirin-Caffeine (BC FAST PAIN RELIEF ARTHRITIS) 1000-65 MG PACK  . cetirizine (ZYRTEC) 10 MG tablet  . gabapentin (NEURONTIN) 300 MG capsule  . hydrochlorothiazide (HYDRODIURIL) 25 MG tablet  . acetaminophen (TYLENOL) 500 MG tablet  . traMADol (ULTRAM) 50 MG tablet   History:   Past Medical History:  Diagnosis Date  . Arthritis    back, hips, knees  . Degenerative disc disease, lumbar    L5  . Depression 2017   situational death of parents  . Hypertension    Pt denies this but is noted in Dr Ferdie PingPraschos note  . PAC (premature atrial contraction)   . Pneumonia 2012  . PVC (premature ventricular contraction)   . Spinal stenosis    Past Surgical History:  Procedure Laterality Date  . ABDOMINAL HYSTERECTOMY    . ARTHRODESIS METATARSALPHALANGEAL JOINT (MTPJ) Right 01/29/2016   Procedure: ARTHRODESIS 1ST METATARSALPHALANGEAL JOINT (MTPJ);  Surgeon: Gwyneth RevelsJustin Fowler, DPM;  [facility];  Service: Podiatry;  Laterality: Right;  . BACK SURGERY     L5  . CHOLECYSTECTOMY    . excision of redundant skin    . FOOT SURGERY    . HARDWARE REMOVAL Right 01/29/2016   Procedure: HARDWARE REMOVAL;  Surgeon: Gwyneth RevelsJustin Fowler, DPM;  [facility];  Service: Podiatry;  Laterality: Right;  PLATE AND 5 SCREWS REMOVED  . MENISCUS REPAIR    . ROTATOR CUFF REPAIR    . toe amputation    . TONSILLECTOMY    . TOTAL KNEE ARTHROPLASTY Left 04/07/2016   Procedure: TOTAL KNEE ARTHROPLASTY;  Surgeon: Kennedy BuckerMichael Menz, MD;  Location: [facility] ORS;   Service: Orthopedics;  Laterality: Left;  . TOTAL KNEE ARTHROPLASTY Right 02/15/2018   Procedure: TOTAL KNEE ARTHROPLASTY;  Surgeon: Kennedy BuckerMenz, Michael, MD;  Location: [facility] ORS;  Service: Orthopedics;  Laterality: Right;   Family History  Problem Relation Age of Onset  . Breast cancer Neg Hx    Social History   Tobacco Use  . Smoking status: Former Smoker    Packs/day: 1.00    Years: 40.00    Pack years: 40.00    Types: Cigarettes    Quit date: 05/2013    Years since quitting: 7.2  . Smokeless tobacco: Never Used  Vaping Use  . Vaping Use: Never used  Substance Use Topics  . Alcohol use: No  . Drug use: No    Pertinent Clinical Results:  LABS:  Hospital Outpatient Visit on 08/19/2020  Component Date Value Ref Range Status  . WBC 08/19/2020 7.9  4.0 - 10.5 K/uL Final  . RBC 08/19/2020 5.08  3.87 - 5.11 MIL/uL Final  . Hemoglobin 08/19/2020 15.1* 12.0 - 15.0 g/dL Final  . HCT 72/53/664405/12/2020 45.3  36.0 - 46.0 % Final  . MCV 08/19/2020 89.2  80.0 - 100.0 fL Final  . MCH 08/19/2020 29.7  26.0 - 34.0 pg Final  . MCHC 08/19/2020 33.3  30.0 - 36.0 g/dL Final  . RDW  08/19/2020 12.9  11.5 - 15.5 % Final  . Platelets 08/19/2020 493* 150 - 400 K/uL Final  . nRBC 08/19/2020 0.0  0.0 - 0.2 % Final  . Neutrophils Relative % 08/19/2020 62  % Final  . Neutro Abs 08/19/2020 5.0  1.7 - 7.7 K/uL Final  . Lymphocytes Relative 08/19/2020 27  % Final  . Lymphs Abs 08/19/2020 2.1  0.7 - 4.0 K/uL Final  . Monocytes Relative 08/19/2020 8  % Final  . Monocytes Absolute 08/19/2020 0.6  0.1 - 1.0 K/uL Final  . Eosinophils Relative 08/19/2020 2  % Final  . Eosinophils Absolute 08/19/2020 0.1  0.0 - 0.5 K/uL Final  . Basophils Relative 08/19/2020 1  % Final  . Basophils Absolute 08/19/2020 0.0  0.0 - 0.1 K/uL Final  . Immature Granulocytes 08/19/2020 0  % Final  . Abs Immature Granulocytes 08/19/2020 0.02  0.00 - 0.07 K/uL Final   Performed at [facility], [address], [place], [place]  [address]  . Sodium 08/19/2020 134* 135 - 145 mmol/L Final  . Potassium 08/19/2020 3.0* 3.5 - 5.1 mmol/L Final  . Chloride 08/19/2020 96* 98 - 111 mmol/L Final  . CO2 08/19/2020 30  22 - 32 mmol/L Final  . Glucose, Bld 08/19/2020 97  70 - 99 mg/dL Final   Glucose reference range applies only to samples taken after fasting for at least 8 hours.  . BUN 08/19/2020 8  8 - 23 mg/dL Final  . Creatinine, Ser 08/19/2020 0.64  0.44 - 1.00 mg/dL Final  . Calcium 53/74/8270 9.5  8.9 - 10.3 mg/dL Final  . Total Protein 08/19/2020 7.9  6.5 - 8.1 g/dL Final  . Albumin 78/67/5449 4.2  3.5 - 5.0 g/dL Final  . AST 20/01/711 26  15 - 41 U/L Final  . ALT 08/19/2020 15  0 - 44 U/L Final  . Alkaline Phosphatase 08/19/2020 54  38 - 126 U/L Final  . Total Bilirubin 08/19/2020 0.5  0.3 - 1.2 mg/dL Final  . GFR, Estimated 08/19/2020 >60  >60 mL/min Final   Comment: (NOTE) Calculated using the CKD-EPI Creatinine Equation (2021)   . Anion gap 08/19/2020 8  5 - 15 Final   Performed at [facility], [address] [place], [place] [address]  . Color, Urine 08/19/2020 YELLOW* YELLOW Final  . APPearance 08/19/2020 CLEAR* CLEAR Final  . Specific Gravity, Urine 08/19/2020 1.008  1.005 - 1.030 Final  . pH 08/19/2020 7.0  5.0 - 8.0 Final  . Glucose, UA 08/19/2020 NEGATIVE  NEGATIVE mg/dL Final  . Hgb urine dipstick 08/19/2020 NEGATIVE  NEGATIVE Final  . Bilirubin Urine 08/19/2020 NEGATIVE  NEGATIVE Final  . Ketones, ur 08/19/2020 NEGATIVE  NEGATIVE mg/dL Final  . Protein, ur 83/25/4982 NEGATIVE  NEGATIVE mg/dL Final  . Nitrite 64/15/8309 NEGATIVE  NEGATIVE Final  . Glori Luis 08/19/2020 NEGATIVE  NEGATIVE Final   Performed at [facility], [address] [place], [place] [address]  . ABO/RH(D) 08/19/2020 B NEG   Final  . Antibody Screen 08/19/2020 NEG   Final  . Sample Expiration 08/19/2020 09/02/2020,2359   Final  . Extend sample reason 08/19/2020    Final                   Value:NO  TRANSFUSIONS OR PREGNANCY IN THE PAST 3 MONTHS Performed at [facility], [address] [place], [place] [address]   . MRSA, PCR 08/19/2020 NEGATIVE  NEGATIVE Final  . Staphylococcus aureus 08/19/2020  NEGATIVE  NEGATIVE Final   Comment: (NOTE) The Xpert SA Assay (FDA approved for NASAL specimens in patients 59 years of age and older), is one component of a comprehensive surveillance program. It is not intended to diagnose infection nor to guide or monitor treatment. Performed at [facility], [address] [place], [place] [address]     ECG: Date: 06/18/2020 Rate: 63 bpm Rhythm: normal sinus Intervals: PR 178 ms. QRS 72 ms. QTc 409 ms. ST segment and T wave changes: No evidence of acute ST segment elevation or depression Comparison: Similar to previous tracing obtained on 08/01/2015 NOTE: Tracing obtained at [facility]; unable for review. Above based on cardiologist's interpretation.    IMAGING / PROCEDURES: LONG TERM CARDIAC EVENT MONITOR STUDY performed on 06/18/2020 1. Predominantly underlying sinus rhythm 2. Mean rate of 79 bpm; heart rate ranged from 46 to 174 bpm.   3. Occasional PVCs and PACs noted 4. Infrequent atrial runs with the longest lasting 6 beats  ECHOCARDIOGRAM performed on 08/22/2015 1. LVEF >55% 2. Normal left ventricular systolic function 3. Normal right ventricular systolic function 4. Mild AR and MR 5. Trivial TR and PR 6. No valvular stenosis 7. No evidence of a pericardial effusion 8. Mild LVH  LEXISCAN performed on 08/08/2015 1. LVEF 56% 2. Regional wall motion reveals normal myocardial thickening and wall motion 3. No artifacts noted 4. Left ventricular cavity size normal 5. No evidence of significant reversible stress-induced myocardial ischemia  6. Nonspecific ST and T wave changes noted during stress. 7. The overall quality of the study is good  Impression and Plan:  Belinda Mitchell has been referred for  pre-anesthesia review and clearance prior to her undergoing the planned anesthetic and procedural courses. Available labs, pertinent testing, and imaging results were personally reviewed by me. This patient has been appropriately cleared by cardiology with an overall LOW risk of significant perioperative cardiovascular complications.  Patient found to be hypokalemic at 3.0 mmol/L during PAT testing.  Communication was sent to primary attending surgeon for review and optimization.  Order has been entered for patient's K+ level to be rechecked on the day of her procedure to ensure correction of noted electrolyte derangement.  Based on clinical review performed today (08/21/20), barring any significant acute changes in the patient's overall condition, it is anticipated that she will be able to proceed with the planned surgical intervention. Any acute changes in clinical condition may necessitate her procedure being postponed and/or cancelled. Patient will meet with anesthesia team (MD and/or CRNA) on the day of her procedure for preoperative evaluation/assessment. Questions regarding anesthetic course will be fielded at that time.   Pre-surgical instructions were reviewed with the patient during her PAT appointment and questions were fielded by PAT clinical staff. Patient was advised that if any questions or concerns arise prior to her procedure then she should return a call to PAT and/or her surgeon's office to discuss.  Belinda Mulling, MSN, APRN, FNP-C, CEN [facility]  Peri-operative Services Nurse Practitioner Phone: [phone] 08/21/20 9:27 AM  NOTE: This note has been prepared using Dragon dictation software. Despite my best ability to proofread, there is always the potential that unintentional transcriptional errors may still occur from this process.

## 2020-08-23 ENCOUNTER — Other Ambulatory Visit: Payer: Self-pay

## 2020-08-23 ENCOUNTER — Other Ambulatory Visit
Admission: RE | Admit: 2020-08-23 | Discharge: 2020-08-23 | Disposition: A | Discharge status: Home / Self Care | Payer: Medicare HMO | Source: Ambulatory Visit | Attending: Orthopedic Surgery | Admitting: Orthopedic Surgery

## 2020-08-23 DIAGNOSIS — Z01812 Encounter for preprocedural laboratory examination: Secondary | ICD-10-CM | POA: Diagnosis present

## 2020-08-23 DIAGNOSIS — Z20822 Contact with and (suspected) exposure to covid-19: Secondary | ICD-10-CM | POA: Insufficient documentation

## 2020-08-23 LAB — SARS CORONAVIRUS 2 (TAT 6-24 HRS): SARS Coronavirus 2: NEGATIVE

## 2020-08-27 ENCOUNTER — Observation Stay
Admission: RE | Admit: 2020-08-27 | Discharge: 2020-08-27 | Disposition: A | Discharge status: Home / Self Care | Payer: Medicare HMO | Attending: Orthopedic Surgery | Admitting: Orthopedic Surgery

## 2020-08-27 ENCOUNTER — Ambulatory Visit: Payer: Medicare HMO

## 2020-08-27 ENCOUNTER — Ambulatory Visit: Payer: Medicare HMO | Admitting: Urgent Care

## 2020-08-27 ENCOUNTER — Other Ambulatory Visit: Payer: Self-pay

## 2020-08-27 ENCOUNTER — Observation Stay: Payer: Medicare HMO

## 2020-08-27 ENCOUNTER — Encounter: Payer: Self-pay | Admitting: Orthopedic Surgery

## 2020-08-27 ENCOUNTER — Encounter
Admission: RE | Disposition: A | Discharge status: Home / Self Care | Payer: Self-pay | Source: Home / Self Care | Attending: Orthopedic Surgery

## 2020-08-27 DIAGNOSIS — M1611 Unilateral primary osteoarthritis, right hip: Secondary | ICD-10-CM | POA: Diagnosis not present

## 2020-08-27 DIAGNOSIS — Z96653 Presence of artificial knee joint, bilateral: Secondary | ICD-10-CM | POA: Insufficient documentation

## 2020-08-27 DIAGNOSIS — Z96649 Presence of unspecified artificial hip joint: Secondary | ICD-10-CM

## 2020-08-27 DIAGNOSIS — Z419 Encounter for procedure for purposes other than remedying health state, unspecified: Secondary | ICD-10-CM

## 2020-08-27 DIAGNOSIS — I10 Essential (primary) hypertension: Secondary | ICD-10-CM | POA: Insufficient documentation

## 2020-08-27 DIAGNOSIS — G8918 Other acute postprocedural pain: Secondary | ICD-10-CM

## 2020-08-27 HISTORY — PX: TOTAL HIP ARTHROPLASTY: SHX124

## 2020-08-27 LAB — POCT I-STAT, CHEM 8
BUN: 10 mg/dL (ref 8–23)
Calcium, Ion: 1.19 mmol/L (ref 1.15–1.40)
Chloride: 104 mmol/L (ref 98–111)
Creatinine, Ser: 0.6 mg/dL (ref 0.44–1.00)
Glucose, Bld: 94 mg/dL (ref 70–99)
HCT: 40 % (ref 36.0–46.0)
Hemoglobin: 13.6 g/dL (ref 12.0–15.0)
Potassium: 3.6 mmol/L (ref 3.5–5.1)
Sodium: 140 mmol/L (ref 135–145)
TCO2: 23 mmol/L (ref 22–32)

## 2020-08-27 SURGERY — ARTHROPLASTY, HIP, TOTAL, ANTERIOR APPROACH
Anesthesia: Spinal | Site: Hip | Laterality: Right

## 2020-08-27 MED ORDER — LACTATED RINGERS IV SOLN: INTRAVENOUS | Status: DC

## 2020-08-27 MED ORDER — MIDAZOLAM HCL 2 MG/2ML IJ SOLN
INTRAMUSCULAR | Status: AC
  Filled 2020-08-27: qty 2

## 2020-08-27 MED ORDER — PROPOFOL 1000 MG/100ML IV EMUL
INTRAVENOUS | Status: AC
  Filled 2020-08-27: qty 100

## 2020-08-27 MED ORDER — SODIUM CHLORIDE 0.9 % IV BOLUS
500.0000 mL | Freq: Once | INTRAVENOUS | Status: AC
  Administered 2020-08-27: 500 mL via INTRAVENOUS

## 2020-08-27 MED ORDER — ONDANSETRON HCL 4 MG/2ML IJ SOLN
INTRAMUSCULAR | Status: DC | PRN
  Administered 2020-08-27: 4 mg via INTRAVENOUS

## 2020-08-27 MED ORDER — GLYCOPYRROLATE 0.2 MG/ML IJ SOLN
INTRAMUSCULAR | Status: DC | PRN
  Administered 2020-08-27: .2 mg via INTRAVENOUS

## 2020-08-27 MED ORDER — BUPIVACAINE-EPINEPHRINE 0.25% -1:200000 IJ SOLN
INTRAMUSCULAR | Status: DC | PRN
  Administered 2020-08-27: 30 mL

## 2020-08-27 MED ORDER — CEFAZOLIN SODIUM-DEXTROSE 2-4 GM/100ML-% IV SOLN
2.0000 g | INTRAVENOUS | Status: AC
  Administered 2020-08-27: 2 g via INTRAVENOUS

## 2020-08-27 MED ORDER — PROPOFOL 10 MG/ML IV BOLUS
INTRAVENOUS | Status: AC
  Filled 2020-08-27: qty 20

## 2020-08-27 MED ORDER — SODIUM CHLORIDE 0.9 % IV SOLN
INTRAVENOUS | Status: AC
  Administered 2020-08-27: 1 g via INTRAVENOUS
  Filled 2020-08-27: qty 10

## 2020-08-27 MED ORDER — FENTANYL CITRATE (PF) 100 MCG/2ML IJ SOLN: 25.0000 ug | INTRAMUSCULAR | Status: DC | PRN

## 2020-08-27 MED ORDER — ORAL CARE MOUTH RINSE: 15.0000 mL | Freq: Once | OROMUCOSAL | Status: AC

## 2020-08-27 MED ORDER — BUPIVACAINE LIPOSOME 1.3 % IJ SUSP
INTRAMUSCULAR | Status: AC
  Filled 2020-08-27: qty 20

## 2020-08-27 MED ORDER — SODIUM CHLORIDE FLUSH 0.9 % IV SOLN
INTRAVENOUS | Status: AC
  Filled 2020-08-27: qty 40

## 2020-08-27 MED ORDER — FAMOTIDINE 20 MG PO TABS: 20.0000 mg | ORAL_TABLET | Freq: Once | ORAL | Status: AC

## 2020-08-27 MED ORDER — FAMOTIDINE 20 MG PO TABS
ORAL_TABLET | ORAL | Status: AC
  Administered 2020-08-27: 20 mg via ORAL
  Filled 2020-08-27: qty 1

## 2020-08-27 MED ORDER — MORPHINE SULFATE (PF) 2 MG/ML IV SOLN
0.5000 mg | INTRAVENOUS | Status: DC | PRN
Start: 2020-08-27 — End: 2020-08-27
  Administered 2020-08-27: 1 mg via INTRAVENOUS

## 2020-08-27 MED ORDER — BUPIVACAINE-EPINEPHRINE (PF) 0.25% -1:200000 IJ SOLN
INTRAMUSCULAR | Status: AC
  Filled 2020-08-27: qty 30

## 2020-08-27 MED ORDER — LIDOCAINE HCL (PF) 2 % IJ SOLN
INTRAMUSCULAR | Status: AC
  Filled 2020-08-27: qty 5

## 2020-08-27 MED ORDER — CHLORHEXIDINE GLUCONATE 0.12 % MT SOLN
OROMUCOSAL | Status: AC
  Administered 2020-08-27: 15 mL via OROMUCOSAL
  Filled 2020-08-27: qty 15

## 2020-08-27 MED ORDER — SODIUM CHLORIDE 0.9 % IV SOLN: 1.0000 g | Freq: Four times a day (QID) | INTRAVENOUS | Status: DC

## 2020-08-27 MED ORDER — MIDAZOLAM HCL 5 MG/5ML IJ SOLN
INTRAMUSCULAR | Status: DC | PRN
  Administered 2020-08-27: 2 mg via INTRAVENOUS

## 2020-08-27 MED ORDER — NEOMYCIN-POLYMYXIN B GU 40-200000 IR SOLN
Status: DC | PRN
  Administered 2020-08-27: 4 mL

## 2020-08-27 MED ORDER — MORPHINE SULFATE (PF) 4 MG/ML IV SOLN
INTRAVENOUS | Status: AC
  Filled 2020-08-27: qty 1

## 2020-08-27 MED ORDER — NEOMYCIN-POLYMYXIN B GU 40-200000 IR SOLN
Status: AC
  Filled 2020-08-27: qty 4

## 2020-08-27 MED ORDER — HYDROCODONE-ACETAMINOPHEN 7.5-325 MG PO TABS: 1.0000 | ORAL_TABLET | ORAL | Status: DC | PRN

## 2020-08-27 MED ORDER — BUPIVACAINE HCL (PF) 0.5 % IJ SOLN
INTRAMUSCULAR | Status: AC
  Filled 2020-08-27: qty 10

## 2020-08-27 MED ORDER — ONDANSETRON HCL 4 MG/2ML IJ SOLN
4.0000 mg | Freq: Once | INTRAMUSCULAR | Status: AC | PRN
  Administered 2020-08-27: 4 mg via INTRAVENOUS

## 2020-08-27 MED ORDER — ONDANSETRON HCL 4 MG/2ML IJ SOLN
INTRAMUSCULAR | Status: AC
  Filled 2020-08-27: qty 2

## 2020-08-27 MED ORDER — ENOXAPARIN SODIUM 40 MG/0.4ML IJ SOSY: 40.0000 mg | PREFILLED_SYRINGE | INTRAMUSCULAR | 0 refills | Status: AC

## 2020-08-27 MED ORDER — PROPOFOL 10 MG/ML IV BOLUS
INTRAVENOUS | Status: DC | PRN
  Administered 2020-08-27: 50 mg via INTRAVENOUS

## 2020-08-27 MED ORDER — PROPOFOL 500 MG/50ML IV EMUL
INTRAVENOUS | Status: DC | PRN
  Administered 2020-08-27: 100 ug/kg/min via INTRAVENOUS

## 2020-08-27 MED ORDER — SODIUM CHLORIDE 0.9 % IV SOLN
INTRAVENOUS | Status: DC | PRN
  Administered 2020-08-27: 40 ug/min via INTRAVENOUS

## 2020-08-27 MED ORDER — HYDROCODONE-ACETAMINOPHEN 5-325 MG PO TABS: 1.0000 | ORAL_TABLET | ORAL | 0 refills | Status: AC | PRN

## 2020-08-27 MED ORDER — LIDOCAINE HCL (CARDIAC) PF 100 MG/5ML IV SOSY
PREFILLED_SYRINGE | INTRAVENOUS | Status: DC | PRN
  Administered 2020-08-27: 30 mg via INTRAVENOUS

## 2020-08-27 MED ORDER — CEFAZOLIN SODIUM-DEXTROSE 2-4 GM/100ML-% IV SOLN
INTRAVENOUS | Status: AC
  Filled 2020-08-27: qty 100

## 2020-08-27 MED ORDER — SODIUM CHLORIDE 0.9 % IV SOLN
INTRAVENOUS | Status: DC | PRN
  Administered 2020-08-27: 60 mL

## 2020-08-27 MED ORDER — PHENYLEPHRINE HCL (PRESSORS) 10 MG/ML IV SOLN
INTRAVENOUS | Status: DC | PRN
  Administered 2020-08-27 (×3): 100 ug via INTRAVENOUS

## 2020-08-27 MED ORDER — HYDROCODONE-ACETAMINOPHEN 7.5-325 MG PO TABS
ORAL_TABLET | ORAL | Status: AC
  Administered 2020-08-27: 1 via ORAL
  Filled 2020-08-27: qty 1

## 2020-08-27 MED ORDER — HYDROCODONE-ACETAMINOPHEN 5-325 MG PO TABS: 1.0000 | ORAL_TABLET | ORAL | Status: DC | PRN

## 2020-08-27 MED ORDER — CHLORHEXIDINE GLUCONATE 0.12 % MT SOLN: 15.0000 mL | Freq: Once | OROMUCOSAL | Status: AC

## 2020-08-27 MED ORDER — SODIUM CHLORIDE 0.9 % IV BOLUS
250.0000 mL | Freq: Once | INTRAVENOUS | Status: AC
  Administered 2020-08-27: 250 mL via INTRAVENOUS

## 2020-08-27 SURGICAL SUPPLY — 59 items
APL PRP STRL LF DISP 70% ISPRP (MISCELLANEOUS) ×1
BLADE SAGITTAL AGGR TOOTH XLG (BLADE) ×2 IMPLANT
BNDG COHESIVE 6X5 TAN STRL LF (GAUZE/BANDAGES/DRESSINGS) ×6 IMPLANT
CANISTER SUCT 1200ML W/VALVE (MISCELLANEOUS) ×2 IMPLANT
CANISTER WOUND CARE 500ML ATS (WOUND CARE) ×2 IMPLANT
CHLORAPREP W/TINT 26 (MISCELLANEOUS) ×2 IMPLANT
COVER BACK TABLE REUSABLE LG (DRAPES) ×2 IMPLANT
COVER WAND RF STERILE (DRAPES) ×2 IMPLANT
DRAPE 3/4 80X56 (DRAPES) ×6 IMPLANT
DRAPE C-ARM XRAY 36X54 (DRAPES) ×2 IMPLANT
DRAPE POUCH INSTRU U-SHP 10X18 (DRAPES) ×2 IMPLANT
DRESSING SURGICEL FIBRLLR 1X2 (HEMOSTASIS) ×2 IMPLANT
DRSG MEPILEX SACRM 8.7X9.8 (GAUZE/BANDAGES/DRESSINGS) ×2 IMPLANT
DRSG OPSITE POSTOP 4X8 (GAUZE/BANDAGES/DRESSINGS) ×4 IMPLANT
DRSG SURGICEL FIBRILLAR 1X2 (HEMOSTASIS) ×4
ELECT BLADE 6.5 EXT (BLADE) ×2 IMPLANT
ELECT REM PT RETURN 9FT ADLT (ELECTROSURGICAL) ×2
ELECTRODE REM PT RTRN 9FT ADLT (ELECTROSURGICAL) ×1 IMPLANT
GLOVE SURG SYN 9.0  PF PI (GLOVE) ×2
GLOVE SURG SYN 9.0 PF PI (GLOVE) ×2 IMPLANT
GLOVE SURG UNDER POLY LF SZ9 (GLOVE) ×2 IMPLANT
GOWN SRG 2XL LVL 4 RGLN SLV (GOWNS) ×1 IMPLANT
GOWN STRL NON-REIN 2XL LVL4 (GOWNS) ×2
GOWN STRL REUS W/ TWL LRG LVL3 (GOWN DISPOSABLE) ×1 IMPLANT
GOWN STRL REUS W/TWL LRG LVL3 (GOWN DISPOSABLE) ×2
HIP FEM HD S 28 (Head) ×2 IMPLANT
HOLDER FOLEY CATH W/STRAP (MISCELLANEOUS) ×2 IMPLANT
HOOD PEEL AWAY FLYTE STAYCOOL (MISCELLANEOUS) ×2 IMPLANT
KIT PREVENA INCISION MGT 13 (CANNISTER) ×2 IMPLANT
LINER DUAL MOB 50MM (Liner) ×2 IMPLANT
MANIFOLD NEPTUNE II (INSTRUMENTS) ×2 IMPLANT
MAT ABSORB  FLUID 56X50 GRAY (MISCELLANEOUS) ×1
MAT ABSORB FLUID 56X50 GRAY (MISCELLANEOUS) ×1 IMPLANT
NDL SAFETY ECLIPSE 18X1.5 (NEEDLE) ×1 IMPLANT
NEEDLE HYPO 18GX1.5 SHARP (NEEDLE) ×2
NEEDLE SPNL 20GX3.5 QUINCKE YW (NEEDLE) ×4 IMPLANT
NS IRRIG 1000ML POUR BTL (IV SOLUTION) ×2 IMPLANT
PACK HIP COMPR (MISCELLANEOUS) ×2 IMPLANT
SCALPEL PROTECTED #10 DISP (BLADE) ×4 IMPLANT
SHELL ACETABULAR SZ0 50 DME (Shell) ×2 IMPLANT
SOL PREP PVP 2OZ (MISCELLANEOUS) ×2
SOLUTION PREP PVP 2OZ (MISCELLANEOUS) ×1 IMPLANT
SPONGE DRAIN TRACH 4X4 STRL 2S (GAUZE/BANDAGES/DRESSINGS) ×2 IMPLANT
STAPLER SKIN PROX 35W (STAPLE) ×2 IMPLANT
STEM FEMORAL SZ3  STD COLLARED (Stem) ×2 IMPLANT
STRAP SAFETY 5IN WIDE (MISCELLANEOUS) ×2 IMPLANT
SUT DVC 2 QUILL PDO  T11 36X36 (SUTURE) ×1
SUT DVC 2 QUILL PDO T11 36X36 (SUTURE) ×1 IMPLANT
SUT SILK 0 (SUTURE) ×2
SUT SILK 0 30XBRD TIE 6 (SUTURE) ×1 IMPLANT
SUT V-LOC 90 ABS DVC 3-0 CL (SUTURE) ×2 IMPLANT
SUT VIC AB 1 CT1 36 (SUTURE) ×2 IMPLANT
SYR 20ML LL LF (SYRINGE) ×2 IMPLANT
SYR 30ML LL (SYRINGE) ×2 IMPLANT
SYR 50ML LL SCALE MARK (SYRINGE) ×4 IMPLANT
SYR BULB IRRIG 60ML STRL (SYRINGE) ×2 IMPLANT
TAPE MICROFOAM 4IN (TAPE) ×2 IMPLANT
TOWEL OR 17X26 4PK STRL BLUE (TOWEL DISPOSABLE) ×2 IMPLANT
TRAY FOLEY MTR SLVR 16FR STAT (SET/KITS/TRAYS/PACK) ×2 IMPLANT

## 2020-08-27 NOTE — Progress Notes (Signed)
PT Cancellation Note  Patient Details Name: Ndidi Nesby MRN: [id number] DOB: 1955-11-08   Cancelled Treatment:    Reason Eval/Treat Not Completed:  (Consult received and chart reviewed. Patient still with significant paresthesia, weakness to R LE s/p procedure.  Will re-attempt at later time as medically appropriate.)   Meshach Perry H. Manson Passey, PT, DPT, NCS 08/27/20, 1:54 PM [phone]

## 2020-08-27 NOTE — Op Note (Signed)
08/27/2020  11:20 AM  PATIENT:  Belinda Mitchell  65 y.o. female  PRE-OPERATIVE DIAGNOSIS:  Primary osteoarthritis of right hip M16.11  POST-OPERATIVE DIAGNOSIS:  Primary osteoarthritis of right hip M16.11  PROCEDURE:  Procedure(s): TOTAL HIP ARTHROPLASTY ANTERIOR APPROACH (Right)  SURGEON: Leitha Schuller, MD  ASSISTANTS: None  ANESTHESIA:   spinal  EBL:  Total I/O In: 100 [IV Piggyback:100] Out: 350 [Urine:350]  BLOOD ADMINISTERED:none  DRAINS: Incisional wound VAC   LOCAL MEDICATIONS USED:  MARCAINE    and OTHER Exparel  SPECIMEN:  Source of Specimen:  Right femoral head  DISPOSITION OF SPECIMEN:  PATHOLOGY  COUNTS:  YES  TOURNIQUET:  * No tourniquets in log *  IMPLANTS: Medacta AMIS 3 standard stem, 50 mm mPACT TM cup and liner with S 28 mm femoral head  DICTATION: .Dragon Dictation   The patient was brought to the operating room and after spinal anesthesia was obtained patient was placed on the operative table with the ipsilateral foot into the Medacta attachment, contralateral leg on a well-padded table. C-arm was brought in and preop template x-ray taken. After prepping and draping in usual sterile fashion appropriate patient identification and timeout procedures were completed. Anterior approach to the hip was obtained and centered over the greater trochanter and TFL muscle. The subcutaneous tissue was incised hemostasis being achieved by electrocautery. TFL fascia was incised and the muscle retracted laterally deep retractor placed. The lateral femoral circumflex vessels were identified and ligated. The anterior capsule was exposed and a capsulotomy performed. The neck was identified and a femoral neck cut carried out with a saw. The head was removed without difficulty and showed sclerotic femoral head and acetabulum. Reaming was carried out to 50 mm and a 50 mm cup trial gave appropriate tightness to the acetabular component a 50 DM cup was impacted into position. The  leg was then externally rotated and ischiofemoral and pubofemoral releases carried out. The femur was sequentially broached to a size 3, size 3 standard stem with S head trials were placed and the final components chosen. The 3 standard stem was inserted along with a metal S 28 mm head and 50 mm liner. The hip was reduced and was stable the wound was thoroughly irrigated with fibrillar placed along the posterior capsule and medial neck. The deep fascia ws closed using a heavy Quill after infiltration of 30 cc of quarter percent Sensorcaine with epinephrine diluted with Exparel and saline throughout the case, .3-0 V-loc to close the skin with skin staples.  Incisional wound VAC applied and  patient was sent to recovery in stable condition.   PLAN OF CARE: Admit for overnight observation

## 2020-08-27 NOTE — Discharge Instructions (Addendum)
Total Hip Replacement, Anterior, Care After This sheet gives you information about how to care for yourself after your procedure. Your doctor may also give you more specific instructions. If you have problems or questions, contact your doctor. What can I expect after the procedure?  Pain.  Redness and swelling.  Stiffness.  Discomfort.  A small amount of blood coming from your cut from surgery (incision).  A small amount of clear fluid coming from your incision. Follow these instructions at home: Medicines  Take over-the-counter and prescription medicines only as told by your doctor.  Take a medicine to thin your blood (anticoagulant) as told, if your doctor prescribed one.  If told, take steps to prevent problems with pooping (constipation). You may need to: ? Drink enough fluid to keep your pee (urine) pale yellow. ? Take medicines. You will be told what medicines to take. ? Eat foods that are high in fiber. These include beans, whole grains, and fresh fruits and vegetables. ? Limit foods that are high in fat and sugar. These include fried or sweet foods.  Ask your doctor if you should avoid driving or using machines while you are taking your medicine. Incision care  Follow instructions from your doctor about how to take care of your incision. Make sure you: ? Wash your hands with soap and water for at least 20 seconds before and after you change your bandage (dressing). If you cannot use soap and water, use hand sanitizer. ? Change your bandage as told by your doctor. ? Leave stitches (sutures), staples, skin glue, or skin tape (adhesive) strips in place. They may need to stay in place for 2 weeks or longer. If tape strips get loose and curl up, you may trim the loose edges. Do not take off tape strips completely unless your doctor tells you to do that.  Do not take baths, swim, or use a hot tub until your doctor says it is okay.  Check your incision every day for signs of  infection. Check for: ? More redness, swelling, or pain. ? More fluid or blood. ? Warmth. ? Pus or a bad smell.   Managing pain, stiffness, and swelling  If told, put ice on the hip area. To do this: ? Put ice in a plastic bag. ? Place a towel between your skin and the bag. ? Leave the ice on for 20 minutes, 2-3 times a day. ? Take off the ice if your skin turns bright red. This is very important. If you cannot feel pain, heat, or cold, you have a greater risk of damage to the area.  Move your toes often.  Raise your leg above the level of your heart while you are lying down.   Activity  Ask your doctor what activities are safe for you.  Get up to take short walks every 1 to 2 hours. Ask for help if you feel weak or unsteady.  Do exercises as told by your doctor.  Do not use your legs to support (bear) your body weight until your doctor says that you can. Follow instructions about how much weight you may safely support on your injured leg (weight-bearing restrictions).  Use a walker, crutches, or a cane as told by your doctor.  Return to your normal activities when your doctor says that it is safe. Movement restrictions  Follow instructions from your doctor about how much you should let your hip move (movement restrictions). For example: ? Do not cross your legs at the  knees. Try keeping a pillow between your legs when you lie in bed. ? Do not bend farther than 90 degrees at your hip and waist. To keep from bending this far:  Do not bring your knees higher than your hips.  Do not pick up something from the floor while sitting in a chair.  Avoid sitting in low chairs.  Use a raised toilet seat.  When you start to stand up after sitting, keep your injured leg out in front of you. ? Avoid twisting at your waist and reaching across your body to the side of your injured leg. ? Avoid turning your toes inward.  When getting into a car: 1. Raise the seat as high as you  can. 2. Move the seat as far back as it will go. 3. Recline the upper part of the seat slightly. 4. Sit down onto the seat with your injured leg sticking out of the car. 5. Scoot back in the seat as you move the lower half of your body into the car. Try to avoid bumping your foot or leg as you bring it into the car.   Safety  Keep floors clear of things you may trip over.  Place items that you may need within easy reach.  Wear an apron or tool belt with pockets for carrying things. General instructions  Ask your doctor when it is safe to drive.  Wear compression stockings as told by your doctor.  Keep doing breathing exercises as told.  Do not smoke or use any products that contain nicotine or tobacco. If you need help quitting, ask your doctor.  If you plan to visit a dentist: ? Tell your doctor. Ask about things to do before your teeth are cleaned. ? Tell your dentist about your new joint.  Keep all follow-up visits. Contact a doctor if:  You have a fever or chills.  You have a cough.  Your medicine does not help your pain.  You have any of these signs of infection in your incision: ? More redness, swelling, or pain. ? More fluid or blood. ? Warmth. ? Pus or a bad smell. Get help right away if:  You have very bad pain.  You feel short of breath.  You have trouble breathing.  You have chest pain.  You have redness, swelling, pain, or warmth in your leg or in the back of your lower leg (calf).  Your incision opens up after stitches or staples are taken out. These symptoms may be an emergency. Get help right away. Call your local emergency services (911 in the [place]  Do not wait to see if the symptoms will go away.  Do not drive yourself to the hospital. Summary  Take a medicine to thin your blood (anticoagulant) as told, if your doctor prescribed one.  Follow instructions from your doctor about how to take care of your cut from surgery (incision).  Do  not take baths, swim, or use a hot tub until your doctor says it is okay.  Use a walker, crutches, or a cane as told by your doctor. This information is not intended to replace advice given to you by your health care provider. Make sure you discuss any questions you have with your health care provider. Document Revised: 08/24/2019 Document Reviewed: 08/24/2019 Elsevier Patient Education  2021 [employer] Inc.   AMBULATORY SURGERY  DISCHARGE INSTRUCTIONS   1) The drugs that you were given will stay in your system until tomorrow so for the  next 24 hours you should not:  A) Drive an automobile B) Make any legal decisions C) Drink any alcoholic beverage   2) You may resume regular meals tomorrow.  Today it is better to start with liquids and gradually work up to solid foods.  You may eat anything you prefer, but it is better to start with liquids, then soup and crackers, and gradually work up to solid foods.   3) Please notify your doctor immediately if you have any unusual bleeding, trouble breathing, redness and pain at the surgery site, drainage, fever, or pain not relieved by medication.    4) Additional Instructions:        Please contact your physician with any problems or Same Day Surgery at [phone], Monday through Friday 6 am to 4 pm, or [place] [facility] number at [phone].

## 2020-08-27 NOTE — Progress Notes (Signed)
[place] [facility] [facility]  Physical Therapy Certification  Patient Details  Name: Netty Sullivant MRN: [id number] Date of Birth: 20-Jan-1956 Medical Diagnosis: Active Problems:   S/P hip replacement  Visit Diagnosis: PT Visit Diagnosis: Other abnormalities of gait and mobility (R26.89),Pain,Muscle weakness (generalized) (M62.81) Pain - part of body: Hip PT Problem: Decreased strength,Decreased range of motion,Decreased activity tolerance,Decreased balance,Decreased mobility,Decreased knowledge of use of DME,Impaired sensation,Decreased knowledge of precautions,Decreased safety awareness,Cardiopulmonary status limiting activity,Pain  Goals: Pt will go Supine/Side to Sit: with modified independence (for pulmonary hygiene and self-repositioning) Pt will Transfer Bed to Chair/Chair to Bed: with modified independence (LRAD for access to seating surfaces in home environment) Pt will Ambulate: > 125 feet,with least restrictive assistive device,with modified independence (for household mobilization upon discharge) Pt will Go Up / Down Stairs: with supervision,1-2 stairs,with least restrictive assistive device (for entry/exit of home upon discharge)  Duration: Services will be provided through the following date: 09/10/20  Frequency: BID  Amount: one treatment session per day unless otherwise indicated.    Certification Start Date: 08/27/2020 Certification End Date: 09/10/20   PT Treatments/Interventions: Gait training,Stair training,DME instruction,Functional mobility training,Therapeutic activities,Therapeutic exercise,Balance training,Patient/family education  Zahria Ding H. Manson Passey, PT, DPT, NCS 08/27/20, 11:51 PM [phone]

## 2020-08-27 NOTE — H&P (Signed)
Chief Complaint  Patient presents with  . Right Hip - Follow-up  Scheduled for THA 08/27/2020    History of the Present Illness: Belinda Mitchell is a 65 y.o. female here today.   The patient presents for H and P of right total hip arthroplasty preformed on 08/27/2020. Prior x-rays from 12/12/2019 show severe hip osteoarthritis, a very short femoral neck, large osteophytes on the acetabulum, and complete loss of superior joint space, with large cyst in the femoral head.  The patient states she had a slipped epiphysis, worse on the right than the left, and she feels the same pain as she had prior to her right total hip arthroplasty. She notes she is feeling all her ligaments, and the swinging out to the right her leg has done has progressively worsened. She states she has been practicing with a grabber, putting on her shoes, and putting on her pants getting ready for her surgery. She states she has dropped 16 pounds since her last visit.  The patient states she came off of all her medications. She takes hydrochlorothiazide as needed. She brought in the pins from her bilateral hip surgeries for slipped capital femoral epiphysis today.   I have reviewed past medical, surgical, social and family history, and allergies as documented in the EMR.  Past Medical History: Past Medical History:  Diagnosis Date  . Osteoarthritis 2009   Past Surgical History: Past Surgical History:  Procedure Laterality Date  . ARTHROPLASTY TOTAL KNEE Left 04/07/2016  Dr.Broghan Pannone  . B/L Hip Dislocations 1964-1969  x 4  . BACK SURGERY 1995  Lumbar back surgery- done in [place]. L-5  . CHOLECYSTECTOMY 1976  . HYSTERECTOMY 1979  . INGUINAL HERNIA REPAIR 1979  . KNEE ARTHROSCOPY 2003-2009  L x2, R x1  . LAPAROSCOPIC CHOLECYSTECTOMY  . R Foot Rod placed in first, second, third toes 2015  . R Hammer Toe Repair 2003  . REPAIR CHRONIC COMPLETE ROTATOR CUFF TEAR W/ACROMIOPLASTY OPEN Left  . TONSILLECTOMY  . TOTAL KNEE  ARTHROPLASTY (Right) Right 02/15/2018  Kennedy Bucker, MD   Past Family History: Family History  Problem Relation Age of Onset  . Pacemaker Mother  . Heart disease Mother  . Osteoarthritis Mother  . Reflux disease Mother  . Rheum arthritis Mother  . Myocardial Infarction (Heart attack) Father  . High blood pressure (Hypertension) Father  . Gout Father  . Heart disease Father  . Sleep apnea Father  . High blood pressure (Hypertension) Sister  . High blood pressure (Hypertension) Brother  . High blood pressure (Hypertension) Brother   Medications: Current Outpatient Medications Ordered in Epic  Medication Sig Dispense Refill  . aspirin-caffeine (BC ARTHRITIS) 1,000-65 mg PwPk Take 1 Package by mouth as needed  . cetirizine (ZYRTEC) 10 MG tablet Take 1 tablet by mouth as needed  . EPINEPHrine (EPIPEN) 0.3 mg/0.3 mL pen injector  . hydroCHLOROthiazide (HYDRODIURIL) 25 MG tablet Take 1 tablet (25 mg total) by mouth once daily (Patient not taking: Reported on 08/21/2020) 90 tablet 3  . naproxen (NAPROSYN) 500 MG tablet Take 1 tablet (500 mg total) by mouth 2 (two) times daily with meals (Patient not taking: Reported on 08/21/2020) 30 tablet 0   No current Epic-ordered facility-administered medications on file.   Allergies: No Known Allergies   Body mass index is 26.49 kg/m.  Review of Systems: A comprehensive 14 point ROS was performed, reviewed, and the pertinent orthopaedic findings are documented in the HPI.  Vitals:  08/21/20 0937  BP: 116/72  General Physical Examination:   General/Constitutional: No apparent distress: well-nourished and well developed. Eyes: Pupils equal, round with synchronous movement. Lungs: Clear to auscultation HEENT: Normal Vascular: No edema, swelling or tenderness, except as noted in detailed exam. Cardiac: Heart rate and rhythm is regular. Integumentary: No impressive skin lesions present, except as noted in detailed exam. Neuro/Psych:  Normal mood and affect, oriented to person, place and time.  Musculoskeletal Examination:  On exam, right hip has 10 degrees internal rotation and 20 degrees external. Good right hip strength. Right leg shorter than left. Significant antalgic gait.  Radiographs:  No new imaging studies were obtained or reviewed today.  Assessment: ICD-10-CM  1. Primary localized osteoarthritis of both hips M16.0   Plan:  The patient has clinical findings of severe right hip osteoarthritis with shortening on the right.  We discussed the patient's prior x-ray findings. Her surgery is scheduled for 08/26/2020 at 8:00 a.m. I explained right total hip arthroplasty, as well as the postoperative course in detail. We will plan for right total hip arthroplasty next week.  Surgical Risks:  The nature of the condition and the proposed procedure has been reviewed in detail with the patient. Surgical versus non-surgical options and prognosis for recovery have been reviewed and the inherent risks and benefits of each have been discussed including the risks of infection, bleeding, injury to nerves/blood vessels/tendons, incomplete relief of symptoms, persisting pain and/or stiffness, loss of function, complex regional pain syndrome, failure of the procedure, as appropriate.  Teeth: Normal  Attestation: I, Dawn Royse, am documenting for [facility], MD utilizing Nuance DAX.    Electronically signed by Marlena Clipper, MD at 08/21/2020 6:52 PM EDT  Reviewed  H+P. No changes noted.

## 2020-08-27 NOTE — Anesthesia Preprocedure Evaluation (Signed)
Anesthesia Evaluation  Patient identified by MRN, date of birth, ID band Patient awake    Reviewed: Allergy & Precautions, H&P , NPO status , Patient's Chart, lab work & pertinent test results, reviewed documented beta blocker date and time   Airway Mallampati: II   Neck ROM: full    Dental  (+) Teeth Intact   Pulmonary pneumonia, resolved, former smoker,    Pulmonary exam normal        Cardiovascular Exercise Tolerance: Poor hypertension, On Medications negative cardio ROS Normal cardiovascular exam Rhythm:regular Rate:Normal     Neuro/Psych PSYCHIATRIC DISORDERS Depression negative neurological ROS     GI/Hepatic negative GI ROS, Neg liver ROS,   Endo/Other  negative endocrine ROS  Renal/GU negative Renal ROS  negative genitourinary   Musculoskeletal negative musculoskeletal ROS (+)   Abdominal   Peds negative pediatric ROS (+)  Hematology negative hematology ROS (+)   Anesthesia Other Findings Past Medical History: No date: Arthritis     Comment:  back, hips, knees No date: Degenerative disc disease, lumbar     Comment:  L5 2017: Depression     Comment:  situational death of parents No date: Hypertension No date: PAC (premature atrial contraction) 2012: Pneumonia No date: PVC (premature ventricular contraction) No date: Spinal stenosis Past Surgical History: No date: ABDOMINAL HYSTERECTOMY 01/29/2016: ARTHRODESIS METATARSALPHALANGEAL JOINT (MTPJ); Right     Comment:  Procedure: ARTHRODESIS 1ST METATARSALPHALANGEAL JOINT               (MTPJ);  Surgeon: Gwyneth Revels, DPM;  Location: [facility];  Service: Podiatry;  Laterality: Right; No date: BACK SURGERY     Comment:  L5 No date: CHOLECYSTECTOMY No date: excision of redundant skin No date: FOOT SURGERY 01/29/2016: HARDWARE REMOVAL; Right     Comment:  Procedure: HARDWARE REMOVAL;  Surgeon: Gwyneth Revels,               DPM;   Location: [facility];  Service: Podiatry;               Laterality: Right;  PLATE AND 5 SCREWS REMOVED No date: MENISCUS REPAIR No date: ROTATOR CUFF REPAIR No date: toe amputation No date: TONSILLECTOMY 04/07/2016: TOTAL KNEE ARTHROPLASTY; Left     Comment:  Procedure: TOTAL KNEE ARTHROPLASTY;  Surgeon: Kennedy Bucker, MD;  Location: [facility] OR[facility];  Service: Orthopedics;                Laterality: Left; 02/15/2018: TOTAL KNEE ARTHROPLASTY; Right     Comment:  Procedure: TOTAL KNEE ARTHROPLASTY;  Surgeon: Kennedy Bucker, MD;  Location: [facility];  Service: Orthopedics;               Laterality: Right; BMI    Body Mass Index: 26.49 kg/m     Reproductive/Obstetrics negative OB ROS                             Anesthesia Physical Anesthesia Plan  ASA: III  Anesthesia Plan: Spinal   Post-op Pain Management:    Induction:   PONV Risk Score and Plan: 3  Airway Management Planned:   Additional Equipment:   Intra-op Plan:   Post-operative Plan:   Informed Consent:  I have reviewed the patients History and Physical, chart, labs and discussed the procedure including the risks, benefits and alternatives for the proposed anesthesia with the patient or authorized representative who has indicated his/her understanding and acceptance.     Dental Advisory Given  Plan Discussed with: CRNA  Anesthesia Plan Comments:         Anesthesia Quick Evaluation

## 2020-08-27 NOTE — Anesthesia Procedure Notes (Signed)
Procedure Name: MAC Date/Time: 08/27/2020 10:05 AM Performed by: Hedda Slade, CRNA Pre-anesthesia Checklist: Patient identified, Emergency Drugs available, Suction available, Patient being monitored and Timeout performed Patient Re-evaluated:Patient Re-evaluated prior to induction Oxygen Delivery Method: Simple face mask

## 2020-08-27 NOTE — Evaluation (Signed)
Physical Therapy Evaluation Patient Details Name: Belinda Mitchell MRN: [id number] DOB: 23-Jul-1955 Today's Date: 08/27/2020   History of Present Illness  seen in PACU s/p R THA, anterior approach, WBAT (08/27/20).  Clinical Impression  Patient eager for OOB mobility attempts upon return to room this date; generally impulsive and often attempting movement prior to cuing from therapist.  Initially endorses pain in R hip as 0/10 beginning of session; however, escalates to 8-10/10 after mobility/WBing tasks (RN administering pain medication as appropriate).  R LE motor control imrpoved from initial attempt, but not fully returned to baseline.  Able to mobilize R LE strength and ROM grossly 50% normal with strength at least 3-/5.  Able to complete bed mobility with sup/mod indep (using L LE to 'hook' R LE over edge of bed); cga/min assist for sit/stand, basic transfers and gait (50' x1, 25' x1) with RW.  Demonstratespartially reciprocal stepping pattern; min cuing for walker position and overall postural extension.  Heavy WBing bilat UEs; decreased stance time, R TKE in loading phases.  R hip/knee control improved with cuing for exaggerated R TKE until spinal resolves.  Also completes stairs (up/down 4) with bilat rails, cga/min assist; step to gait pattern; step by step cuing for technique.  Continued emphasis on exaggerated R TKE to prevent buckling on stairs. During session, assisted with Toilet transfer, ambulatory with RW, cga/min assist.  During toileting activities, endorses sudden onset of nausea and generally not feeling well (patient attributes to recently taken oxycodone).  Becomes generally restless and visibly uncomfortable.  Recliner brought to bathroom for quick transfer and supported positioning (allowing for recline if needed).  Upon return to room, BP noted at 101/72. Other Exercises: Orthostatics then assessed--semi-supine BP 119/80, HR  sitting BP 101/7, HR 85; standing BP 77/44, HR 82.   Unable to tolerate standing position for full measurement; unable to tolerate additional mobility.  RN informed/aware and at bedside for fluid bolus.  MD informed/aware of performance, response to activity/orthostasis and therapist concerns over potential discharge home this date; may benefit from observation overnight to optimize pain control and BP support. Would benefit from skilled PT to address above deficits and promote optimal return to PLOF.; Recommend transition to HHPT upon discharge from acute hospitalization.      Follow Up Recommendations Home health PT    Equipment Recommendations       Recommendations for Other Services       Precautions / Restrictions Precautions Precautions: Fall;Anterior Hip Restrictions Weight Bearing Restrictions: Yes RLE Weight Bearing: Weight bearing as tolerated      Mobility  Bed Mobility Overal bed mobility: Modified Independent             General bed mobility comments: uses L LE to 'hook' R LE for self-assist over edge of bed    Transfers Overall transfer level: Needs assistance Equipment used: Rolling walker (2 wheeled) Transfers: Sit to/from Stand Sit to Stand: Min assist;Min guard         General transfer comment: cuing for hand placement; cuing for pauses with movement transitions to optimize BP control, LE function/stability  Ambulation/Gait Ambulation/Gait assistance: Min guard;Min assist Gait Distance (Feet):  (50' x1, 25' x1) Assistive device: Rolling walker (2 wheeled)       General Gait Details: partially reciprocal stepping pattern; min cuing for walker position and overall postural extension.  Heavy WBing bilat UEs; decreased stance time, R TKE in loading phases.  R hip/knee control improved with cuing for exaggerated R TKE until spinal resolves.  Stairs Stairs: Yes Stairs assistance: Min guard;Min assist Stair Management: Two rails Number of Stairs: 4 General stair comments: step to gait pattern; step  by step cuing for technique.  Continued emphasis on exaggerated R TKE to prevent buckling on stairs.  Wheelchair Mobility    Modified Rankin (Stroke Patients Only)       Balance Overall balance assessment: Needs assistance Sitting-balance support: No upper extremity supported;Feet supported Sitting balance-Leahy Scale: Good     Standing balance support: Bilateral upper extremity supported Standing balance-Leahy Scale: Fair                               Pertinent Vitals/Pain Pain Assessment: Faces Faces Pain Scale: Hurts whole lot Pain Location: at rest and intermittently during session, reports as 0/10; post mobility, endorsing 8-10/10 Pain Descriptors / Indicators: Aching;Guarding;Headache Pain Intervention(s): Limited activity within patient's tolerance;Monitored during session;Premedicated before session;Repositioned;RN gave pain meds during session    Home Living Family/patient expects to be discharged to:: Private residence Living Arrangements: Alone Available Help at Discharge: Family;Available PRN/intermittently Type of Home: House Home Access: Stairs to enter Entrance Stairs-Rails: Right Entrance Stairs-Number of Steps: 1 Home Layout: One level Home Equipment: Walker - 2 wheels;Bedside commode      Prior Function Level of Independence: Independent         Comments: Indep with ADLs, household and community distances without assist device; recently limited by worsening pain in R hip.  Denies fall history.     Hand Dominance   Dominant Hand: Right    Extremity/Trunk Assessment   Upper Extremity Assessment Upper Extremity Assessment: Overall WFL for tasks assessed    Lower Extremity Assessment Lower Extremity Assessment:  (R LE grossly 3-/5, block not fully resolved)       Communication   Communication: No difficulties  Cognition Arousal/Alertness: Awake/alert Behavior During Therapy: WFL for tasks assessed/performed Overall Cognitive  Status: Within Functional Limits for tasks assessed                                 General Comments: generally impulsive and insistent on discharge home this date      General Comments      Exercises Other Exercises Other Exercises: Toilet transfer, ambulatory with RW, cga/min assist.  During toileting activities, endorses sudden onset of nausea and generally not feeling well (patient attributes to recently taken oxycodone).  Becomes generally restless and visibly uncomfortable.  Recliner brought to bathroom for quick transfer and supported positioning (allowing for recline if needed).  Upon return to room, BP noted at 101/72. Other Exercises: Orthostatics then assessed--semi-supine BP 119/80, HR  sitting BP 101/7, HR 85; standing BP 77/44, HR 82.  Unable to tolerate standing position for full measurement; unable to tolerate additional mobility.  RN informed/aware and at bedside for fluid bolus. Other Exercises: Verbally reviewed technique for car transfer; issued handout with written/pictorial descriptions of supine therex for use as HEP; reviewed WBing precautions and progressive mobility/use of RW.  Patient voiced understanding of all information.   Assessment/Plan    PT Assessment Patient needs continued PT services  PT Problem List Decreased strength;Decreased range of motion;Decreased activity tolerance;Decreased balance;Decreased mobility;Decreased knowledge of use of DME;Impaired sensation;Decreased knowledge of precautions;Decreased safety awareness;Cardiopulmonary status limiting activity;Pain       PT Treatment Interventions Gait training;Stair training;DME instruction;Functional mobility training;Therapeutic activities;Therapeutic exercise;Balance training;Patient/family education    PT Goals (  Current goals can be found in the Care Plan section)  Acute Rehab PT Goals Patient Stated Goal: to go home to be with my companion PT Goal Formulation: With patient Time  For Goal Achievement: 09/10/20 Potential to Achieve Goals: Good    Frequency BID   Barriers to discharge Decreased caregiver support      Co-evaluation               AM-PAC PT "6 Clicks" Mobility  Outcome Measure Help needed turning from your back to your side while in a flat bed without using bedrails?: None Help needed moving from lying on your back to sitting on the side of a flat bed without using bedrails?: A Little Help needed moving to and from a bed to a chair (including a wheelchair)?: A Little Help needed standing up from a chair using your arms (e.g., wheelchair or bedside chair)?: A Little Help needed to walk in hospital room?: A Little Help needed climbing 3-5 steps with a railing? : A Little 6 Click Score: 19    End of Session Equipment Utilized During Treatment: Gait belt Activity Tolerance: Patient tolerated treatment well;Treatment limited secondary to medical complications (Comment) (symptomatic orthostasis) Patient left: in chair;with nursing/sitter in room Nurse Communication: Mobility status PT Visit Diagnosis: Other abnormalities of gait and mobility (R26.89);Pain;Muscle weakness (generalized) (M62.81) Pain - part of body: Hip    Time: 1610-9604 PT Time Calculation (min) (ACUTE ONLY): 45 min   Charges:   PT Evaluation $PT Eval High Complexity: 1 High PT Treatments $Gait Training: 8-22 mins $Therapeutic Activity: 23-37 mins        Cailyn Houdek H. Manson Passey, PT, DPT, NCS 08/27/20, 11:50 PM [phone]

## 2020-08-27 NOTE — Transfer of Care (Signed)
Immediate Anesthesia Transfer of Care Note  Patient: Belinda Mitchell  Procedure(s) Performed: TOTAL HIP ARTHROPLASTY ANTERIOR APPROACH (Right Hip)  Patient Location: PACU  Anesthesia Type:MAC and Spinal  Level of Consciousness: awake and oriented  Airway & Oxygen Therapy: Patient Spontanous Breathing and Patient connected to face mask  Post-op Assessment: Report given to RN and Post -op Vital signs reviewed and stable  Post vital signs: Reviewed and stable  Last Vitals:  Vitals Value Taken Time  BP 119/89 08/27/20 1119  Temp    Pulse 71 08/27/20 1121  Resp 14 08/27/20 1121  SpO2 100 % 08/27/20 1121  Vitals shown include unvalidated device data.  Last Pain:  Vitals:   08/27/20 0747  TempSrc: Temporal  PainSc: 0-No pain      Patients Stated Pain Goal: 0 (56/31/49 7026)  Complications: No complications documented.

## 2020-08-28 ENCOUNTER — Encounter: Payer: Self-pay | Admitting: Orthopedic Surgery

## 2020-08-28 LAB — SURGICAL PATHOLOGY

## 2020-08-28 NOTE — Discharge Summary (Signed)
Physician Discharge Summary  Patient ID: Belinda Mitchell MRN: [id number] DOB/AGE: June 13, 1955 65 y.o.  Admit date: 08/27/2020 Discharge date: 08/27/2020 Admission Diagnoses:  S/P hip replacement [Z96.649]   Discharge Diagnoses: Patient Active Problem List   Diagnosis Date Noted  . S/P hip replacement 08/27/2020  . S/P TKR (total knee replacement) using cement, right 02/15/2018  . Primary osteoarthritis of left knee 04/07/2016    Past Medical History:  Diagnosis Date  . Arthritis    back, hips, knees  . Degenerative disc disease, lumbar    L5  . Depression 08/01/2015   situational death of parents  . Hypertension   . PAC (premature atrial contraction)   . Pneumonia 01-Aug-2010  . PVC (premature ventricular contraction)   . Spinal stenosis      Transfusion: none   Consultants (if any):   Discharged Condition: Improved  Hospital Course: Belinda Mitchell is an 65 y.o. female who was admitted 08/27/2020 with a diagnosis of right hip osteoarthritis and went to the operating room on 08/27/2020 and underwent the above named procedures.    Surgeries: Procedure(s): TOTAL HIP ARTHROPLASTY ANTERIOR APPROACH on 08/27/2020 Patient tolerated the surgery well. Taken to PACU where she was stabilized and then transferred to the orthopedic floor.  Started on Lovenox, Heels elevated on bed with rolled towels. Patient's foley was d/c on day #0. Patient was stable and requesting discharge day of surgery. Patient stable and ready for discharge.  Implants:Medacta AMIS 3 standard stem, 50 mm mPACT TM cup and liner with S 28 mm femoral head  She was given perioperative antibiotics:  Anti-infectives (From admission, onward)   Start     Dose/Rate Route Frequency Ordered Stop   08/27/20 1315  ceFAZolin (ANCEF) 1 g in sodium chloride 0.9 % 100 mL IVPB  Status:  Discontinued        1 g 200 mL/hr over 30 Minutes Intravenous Every 6 hours 08/27/20 1307 08/27/20 08-01-2219   08/27/20 0734  ceFAZolin (ANCEF) 2-4  GM/100ML-% IVPB  Status:  Discontinued       Note to Pharmacy: Kerman Passey, Cryst: cabinet override      08/27/20 0734 08/27/20 1944   08/27/20 0600  ceFAZolin (ANCEF) IVPB 2g/100 mL premix        2 g 200 mL/hr over 30 Minutes Intravenous On call to O.R. 08/27/20 0419 08/27/20 1020    .  She was given sequential compression devices, early ambulation, and Lovenox for DVT prophylaxis.  She benefited maximally from the hospital stay and there were no complications.    Recent vital signs:  Vitals:   08/27/20 1610 08/27/20 1616  BP: (!) 148/90 (!) 146/88  Pulse: 75 94  Resp: 14 16  Temp:    SpO2: 90%     Recent laboratory studies:  Lab Results  Component Value Date   HGB 13.6 08/27/2020   HGB 15.1 (H) 08/19/2020   HGB 13.1 02/16/2018   Lab Results  Component Value Date   WBC 7.9 08/19/2020   PLT 493 (H) 08/19/2020   Lab Results  Component Value Date   INR 0.87 02/03/2018   Lab Results  Component Value Date   NA 140 08/27/2020   K 3.6 08/27/2020   CL 104 08/27/2020   CO2 30 08/19/2020   BUN 10 08/27/2020   CREATININE 0.60 08/27/2020   GLUCOSE 94 08/27/2020    Discharge Medications:   Allergies as of 08/27/2020   No Known Allergies     Medication List    TAKE  these medications   acetaminophen 500 MG tablet Commonly known as: TYLENOL Take 1-2 tablets (500-1,000 mg total) by mouth every 6 (six) hours as needed for mild pain (pain score 1-3 or temp > 100.5).   BC Fast Pain Relief Arthritis 1000-65 MG Pack Generic drug: Aspirin-Caffeine Take 1 Package by mouth daily as needed (pain).   cetirizine 10 MG tablet Commonly known as: ZYRTEC Take 10 mg by mouth daily as needed for allergies.   enoxaparin 40 MG/0.4ML injection Commonly known as: LOVENOX Inject 0.4 mLs (40 mg total) into the skin daily for 12 days.   gabapentin 300 MG capsule Commonly known as: NEURONTIN Take 300 mg by mouth at bedtime.   hydrochlorothiazide 25 MG tablet Commonly known as:  HYDRODIURIL Take 25 mg by mouth daily as needed (for fluid).   HYDROcodone-acetaminophen 5-325 MG tablet Commonly known as: NORCO/VICODIN Take 1-2 tablets by mouth every 4 (four) hours as needed for moderate pain (pain score 4-6).   traMADol 50 MG tablet Commonly known as: ULTRAM Take 50 mg by mouth at bedtime.       Diagnostic Studies: DG HIP OPERATIVE UNILAT W OR W/O PELVIS RIGHT  Result Date: 08/27/2020 CLINICAL DATA:  Total hip replacement EXAM: OPERATIVE RIGHT HIP  1 VIEW TECHNIQUE: Fluoroscopic spot image(s) were submitted for interpretation post-operatively. COMPARISON:  None. FLUOROSCOPY TIME:  0 minutes 6 seconds; 2.4 mGy; 2 acquired images FINDINGS: Total hip replacement noted on the right with prosthetic components well-seated on frontal view. No acute fracture or dislocation. IMPRESSION: Total hip replacement right with prosthetic components well-seated on frontal view. No fracture or dislocation. Electronically Signed   By: Bretta Bang III M.D.   On: 08/27/2020 12:52   DG HIP UNILAT W OR W/O PELVIS 2-3 VIEWS RIGHT  Result Date: 08/27/2020 CLINICAL DATA:  Postoperative total hip replacement on the right EXAM: DG HIP (WITH OR WITHOUT PELVIS) 2-3V RIGHT COMPARISON:  Intraoperative right hip images Aug 17, 2020 FINDINGS: Frontal and lateral views were obtained. There is a total hip replacement right with prosthetic components well-seated. No fracture or dislocation. There is soft tissue air and overlying skin staples. IMPRESSION: Status post total hip replacement right with prosthetic components well-seated. No fracture or dislocation. Acute postoperative changes noted. Electronically Signed   By: Bretta Bang III M.D.   On: 08/27/2020 12:51    Disposition: Discharge disposition: 01-Home or Self Care          Follow-up Information    Kennedy Bucker, MD Follow up on 09/10/2020.   Specialty: Orthopedic Surgery Contact information: [address] [place] [address] [phone]                Signed: Amador Cunas [facility] 08/28/2020, 9:21 AM

## 2020-08-28 NOTE — Anesthesia Postprocedure Evaluation (Signed)
Anesthesia Post Note  Patient: Belinda Mitchell  Procedure(s) Performed: TOTAL HIP ARTHROPLASTY ANTERIOR APPROACH (Right Hip)  Patient location during evaluation: Nursing Unit Anesthesia Type: Spinal Level of consciousness: oriented, awake and alert and awake Pain management: pain level controlled Vital Signs Assessment: post-procedure vital signs reviewed and stable Respiratory status: spontaneous breathing, nonlabored ventilation and respiratory function stable Cardiovascular status: blood pressure returned to baseline and stable Postop Assessment: no headache and no backache Anesthetic complications: no   No complications documented.   Last Vitals:  Vitals:   08/27/20 1610 08/27/20 1616  BP: (!) 148/90 (!) 146/88  Pulse: 75 94  Resp: 14 16  Temp:    SpO2: 90%     Last Pain:  Vitals:   08/27/20 1410  TempSrc:   PainSc: 0-No pain                 [employer]

## 2021-07-14 ENCOUNTER — Other Ambulatory Visit: Payer: Self-pay | Admitting: Family Medicine

## 2021-07-14 DIAGNOSIS — M5416 Radiculopathy, lumbar region: Secondary | ICD-10-CM

## 2021-07-22 ENCOUNTER — Other Ambulatory Visit: Payer: Self-pay | Admitting: Physician Assistant

## 2021-07-22 ENCOUNTER — Ambulatory Visit: Payer: Medicare HMO

## 2021-07-22 DIAGNOSIS — Z1231 Encounter for screening mammogram for malignant neoplasm of breast: Secondary | ICD-10-CM

## 2021-07-26 ENCOUNTER — Ambulatory Visit
Admission: RE | Admit: 2021-07-26 | Discharge: 2021-07-26 | Disposition: A | Discharge status: Home / Self Care | Payer: Medicare HMO | Source: Ambulatory Visit | Attending: Family Medicine | Admitting: Family Medicine

## 2021-07-26 DIAGNOSIS — M5416 Radiculopathy, lumbar region: Secondary | ICD-10-CM | POA: Insufficient documentation

## 2021-12-19 ENCOUNTER — Telehealth: Payer: Self-pay

## 2021-12-19 ENCOUNTER — Other Ambulatory Visit: Payer: Self-pay

## 2021-12-19 DIAGNOSIS — Z1211 Encounter for screening for malignant neoplasm of colon: Secondary | ICD-10-CM

## 2021-12-19 MED ORDER — PEG 3350-KCL-NA BICARB-NACL 420 G PO SOLR: 4000.0000 mL | Freq: Once | ORAL | 0 refills | Status: AC

## 2021-12-19 NOTE — Telephone Encounter (Signed)
Gastroenterology Pre-Procedure Review  Request Date: 12/25/21 Requesting Physician: Dr. Tobi Bastos  PATIENT REVIEW QUESTIONS: The patient responded to the following health history questions as indicated:    1. Are you having any GI issues? yes (diverticulosis, hemorrhoids, constipation) 2. Do you have a personal history of Polyps? no 3. Do you have a family history of Colon Cancer or Polyps? no 4. Diabetes Mellitus? no 5. Joint replacements in the past 12 months? Right hip replacement  last year 6. Major health problems in the past 3 months?no 7. Any artificial heart valves, MVP, or defibrillator?no    MEDICATIONS & ALLERGIES:    Patient reports the following regarding taking any anticoagulation/antiplatelet therapy:   Plavix, Coumadin, Eliquis, Xarelto, Lovenox, Pradaxa, Brilinta, or Effient? no Aspirin? no  Patient confirms/reports the following medications:  Current Outpatient Medications  Medication Sig Dispense Refill   acetaminophen (TYLENOL) 500 MG tablet Take 1-2 tablets (500-1,000 mg total) by mouth every 6 (six) hours as needed for mild pain (pain score 1-3 or temp > 100.5). (Patient not taking: No sig reported)     Aspirin-Caffeine (BC FAST PAIN RELIEF ARTHRITIS) 1000-65 MG PACK Take 1 Package by mouth daily as needed (pain).     cetirizine (ZYRTEC) 10 MG tablet Take 10 mg by mouth daily as needed for allergies.     enoxaparin (LOVENOX) 40 MG/0.4ML injection Inject 0.4 mLs (40 mg total) into the skin daily for 12 days. 4.8 mL 0   gabapentin (NEURONTIN) 300 MG capsule Take 300 mg by mouth at bedtime.     hydrochlorothiazide (HYDRODIURIL) 25 MG tablet Take 25 mg by mouth daily as needed (for fluid).     HYDROcodone-acetaminophen (NORCO/VICODIN) 5-325 MG tablet Take 1-2 tablets by mouth every 4 (four) hours as needed for moderate pain (pain score 4-6). 40 tablet 0   traMADol (ULTRAM) 50 MG tablet Take 50 mg by mouth at bedtime.     No current facility-administered medications for  this visit.    Patient confirms/reports the following allergies:  No Known Allergies  No orders of the defined types were placed in this encounter.   AUTHORIZATION INFORMATION Primary Insurance: 1D#: Group #:  Secondary Insurance: 1D#: Group #:  SCHEDULE INFORMATION: Date: 12/25/21 Time: Location: [facility]

## 2021-12-24 ENCOUNTER — Encounter: Payer: Self-pay | Admitting: Gastroenterology

## 2021-12-25 ENCOUNTER — Encounter: Payer: Self-pay | Admitting: Certified Registered"

## 2021-12-25 ENCOUNTER — Encounter: Admission: RE | Payer: Self-pay | Source: Home / Self Care

## 2021-12-25 ENCOUNTER — Ambulatory Visit: Admission: RE | Admit: 2021-12-25 | Payer: Medicare HMO | Source: Home / Self Care | Admitting: Gastroenterology

## 2021-12-25 SURGERY — COLONOSCOPY WITH PROPOFOL
Anesthesia: General

## 2022-04-02 ENCOUNTER — Telehealth: Payer: Self-pay | Admitting: Surgery

## 2022-04-02 DIAGNOSIS — T148XXA Other injury of unspecified body region, initial encounter: Secondary | ICD-10-CM

## 2022-04-02 DIAGNOSIS — Z203 Contact with and (suspected) exposure to rabies: Secondary | ICD-10-CM
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
## 2022-05-25 ENCOUNTER — Other Ambulatory Visit: Payer: Self-pay | Admitting: Family

## 2022-05-25 DIAGNOSIS — Z780 Asymptomatic menopausal state: Secondary | ICD-10-CM

## 2022-05-25 DIAGNOSIS — Z1231 Encounter for screening mammogram for malignant neoplasm of breast: Secondary | ICD-10-CM

## 2022-08-27 ENCOUNTER — Ambulatory Visit: Payer: Medicare HMO | Admitting: Nurse Practitioner

## 2022-09-17 ENCOUNTER — Emergency Department
Admission: EM | Admit: 2022-09-17 | Discharge: 2022-09-18 | Disposition: A | Discharge status: Home / Self Care | Payer: Medicare HMO | Attending: Emergency Medicine | Admitting: Emergency Medicine

## 2022-09-17 ENCOUNTER — Emergency Department: Payer: Medicare HMO

## 2022-09-17 ENCOUNTER — Other Ambulatory Visit: Payer: Self-pay

## 2022-09-17 DIAGNOSIS — Y908 Blood alcohol level of 240 mg/100 ml or more: Secondary | ICD-10-CM | POA: Diagnosis not present

## 2022-09-17 DIAGNOSIS — F10929 Alcohol use, unspecified with intoxication, unspecified: Secondary | ICD-10-CM

## 2022-09-17 DIAGNOSIS — F10129 Alcohol abuse with intoxication, unspecified: Secondary | ICD-10-CM | POA: Insufficient documentation

## 2022-09-17 DIAGNOSIS — S59911A Unspecified injury of right forearm, initial encounter: Secondary | ICD-10-CM | POA: Diagnosis present

## 2022-09-17 DIAGNOSIS — R4182 Altered mental status, unspecified: Secondary | ICD-10-CM | POA: Diagnosis not present

## 2022-09-17 DIAGNOSIS — W25XXXA Contact with sharp glass, initial encounter: Secondary | ICD-10-CM | POA: Insufficient documentation

## 2022-09-17 DIAGNOSIS — S51811A Laceration without foreign body of right forearm, initial encounter: Secondary | ICD-10-CM | POA: Diagnosis not present

## 2022-09-17 DIAGNOSIS — Z23 Encounter for immunization: Secondary | ICD-10-CM | POA: Insufficient documentation

## 2022-09-17 LAB — CBC WITH DIFFERENTIAL/PLATELET
Abs Immature Granulocytes: 0.03 10*3/uL (ref 0.00–0.07)
Basophils Absolute: 0 10*3/uL (ref 0.0–0.1)
Basophils Relative: 1 %
Eosinophils Absolute: 0.1 10*3/uL (ref 0.0–0.5)
Eosinophils Relative: 1 %
HCT: 37 % (ref 36.0–46.0)
Hemoglobin: 12.3 g/dL (ref 12.0–15.0)
Immature Granulocytes: 0 %
Lymphocytes Relative: 32 %
Lymphs Abs: 2.4 10*3/uL (ref 0.7–4.0)
MCH: 31.5 pg (ref 26.0–34.0)
MCHC: 33.2 g/dL (ref 30.0–36.0)
MCV: 94.9 fL (ref 80.0–100.0)
Monocytes Absolute: 0.5 10*3/uL (ref 0.1–1.0)
Monocytes Relative: 7 %
Neutro Abs: 4.5 10*3/uL (ref 1.7–7.7)
Neutrophils Relative %: 59 %
Platelets: 389 10*3/uL (ref 150–400)
RBC: 3.9 MIL/uL (ref 3.87–5.11)
RDW: 14.3 % (ref 11.5–15.5)
WBC: 7.6 10*3/uL (ref 4.0–10.5)
nRBC: 0 % (ref 0.0–0.2)

## 2022-09-17 LAB — BASIC METABOLIC PANEL
Anion gap: 11 (ref 5–15)
BUN: 13 mg/dL (ref 8–23)
CO2: 22 mmol/L (ref 22–32)
Calcium: 8.4 mg/dL — ABNORMAL LOW (ref 8.9–10.3)
Chloride: 109 mmol/L (ref 98–111)
Creatinine, Ser: 0.55 mg/dL (ref 0.44–1.00)
GFR, Estimated: >60 mL/min (ref >60)
Glucose, Bld: 112 mg/dL — ABNORMAL HIGH (ref 70–99)
Potassium: 3.6 mmol/L (ref 3.5–5.1)
Sodium: 142 mmol/L (ref 135–145)

## 2022-09-17 LAB — ETHANOL: Alcohol, Ethyl (B): 329 mg/dL (ref <10)

## 2022-09-17 MED ORDER — LIDOCAINE-EPINEPHRINE (PF) 2 %-1:200000 IJ SOLN
10.0000 mL | Freq: Once | INTRAMUSCULAR | Status: AC
  Administered 2022-09-17: 10 mL
  Filled 2022-09-17: qty 10

## 2022-09-17 MED ORDER — TETANUS-DIPHTH-ACELL PERTUSSIS 5-2.5-18.5 LF-MCG/0.5 IM SUSY
0.5000 mL | PREFILLED_SYRINGE | Freq: Once | INTRAMUSCULAR | Status: AC
  Administered 2022-09-17: 0.5 mL via INTRAMUSCULAR
  Filled 2022-09-17: qty 0.5

## 2022-09-17 NOTE — ED Notes (Signed)
Critical Results ETOH 329 MD notified
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
## 2022-09-17 NOTE — ED Notes (Signed)
Spoke with CT and MD, Pt still not following instructions, pt is screaming profanities. Will take to CT when she metabolizes and becomes more compliant.
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
## 2022-09-17 NOTE — ED Notes (Signed)
Pt taken to CT. Pt more compliant with requests at this time

## 2022-09-17 NOTE — Discharge Instructions (Addendum)
You were seen in the emergency department today for evaluation of lacerations on your arm.  We did repair these with sutures.  Please have these removed in 7 days.  You can go to your primary care, urgent care, or return to the ER.  Please avoid drinking alcohol in excess.  Return to the ER for any events.
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
## 2022-09-17 NOTE — ED Notes (Addendum)
Please call son Jonny Ruiz- [phone], asked if son was coming. He wants to be called when she is dc'd
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
## 2022-09-18 NOTE — ED Notes (Addendum)
Per Katrinka Blazing MD, pt okay to wait for ride in lobby. Pt able to ambulate by self with no distress.

## 2022-10-22 ENCOUNTER — Ambulatory Visit: Payer: Medicare HMO | Admitting: Nurse Practitioner

## 2022-11-11 ENCOUNTER — Ambulatory Visit: Payer: Medicare HMO | Admitting: Nurse Practitioner

## 2022-12-18 ENCOUNTER — Other Ambulatory Visit: Payer: Medicare HMO

## 2022-12-28 IMAGING — MG MM DIGITAL SCREENING BILAT W/ TOMO AND CAD
6 of 10 series · 6 of 30 positions shown · non-contrast
Comparison: Previous exam(s).

ACR Breast Density Category a: The breast tissue is almost entirely
fatty.

CLINICAL DATA: Screening.

EXAM:
DIGITAL SCREENING BILATERAL MAMMOGRAM WITH TOMOSYNTHESIS AND CAD
TECHNIQUE: Bilateral screening digital craniocaudal and mediolateral oblique
mammograms were obtained. Bilateral screening digital breast
tomosynthesis was performed. The images were evaluated with
computer-aided detection.

[L MLO synth-2D]
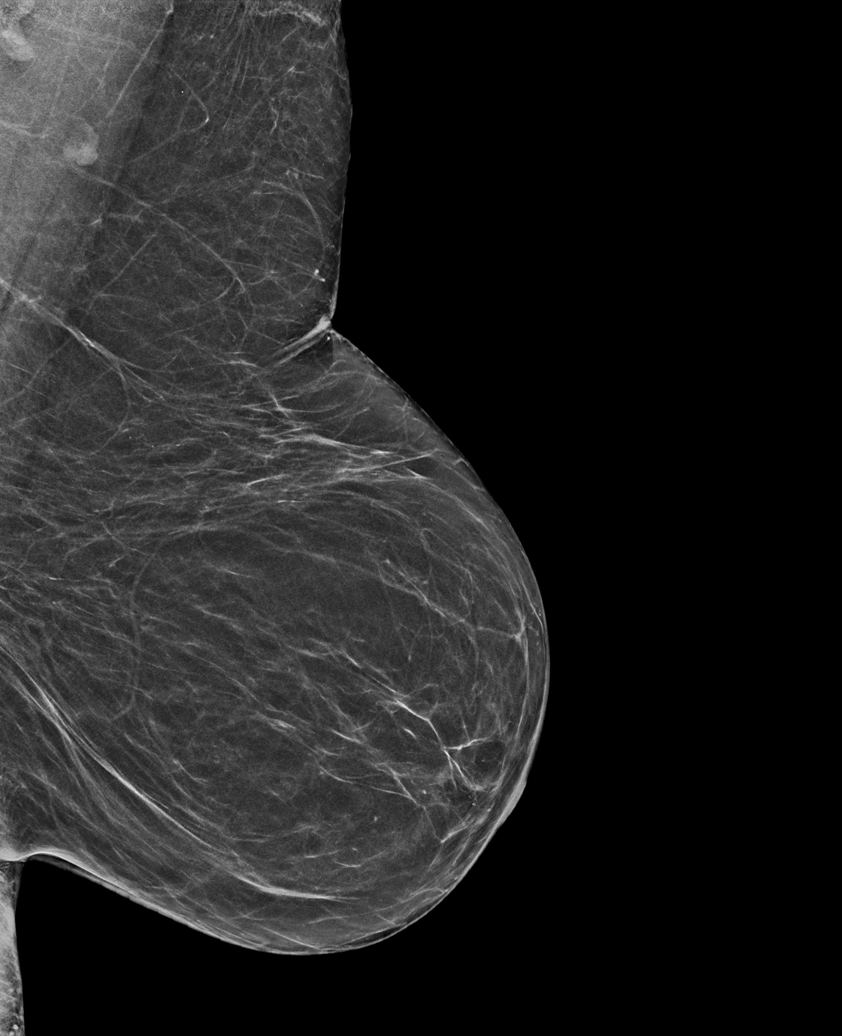

[R XCCL synth-2D]
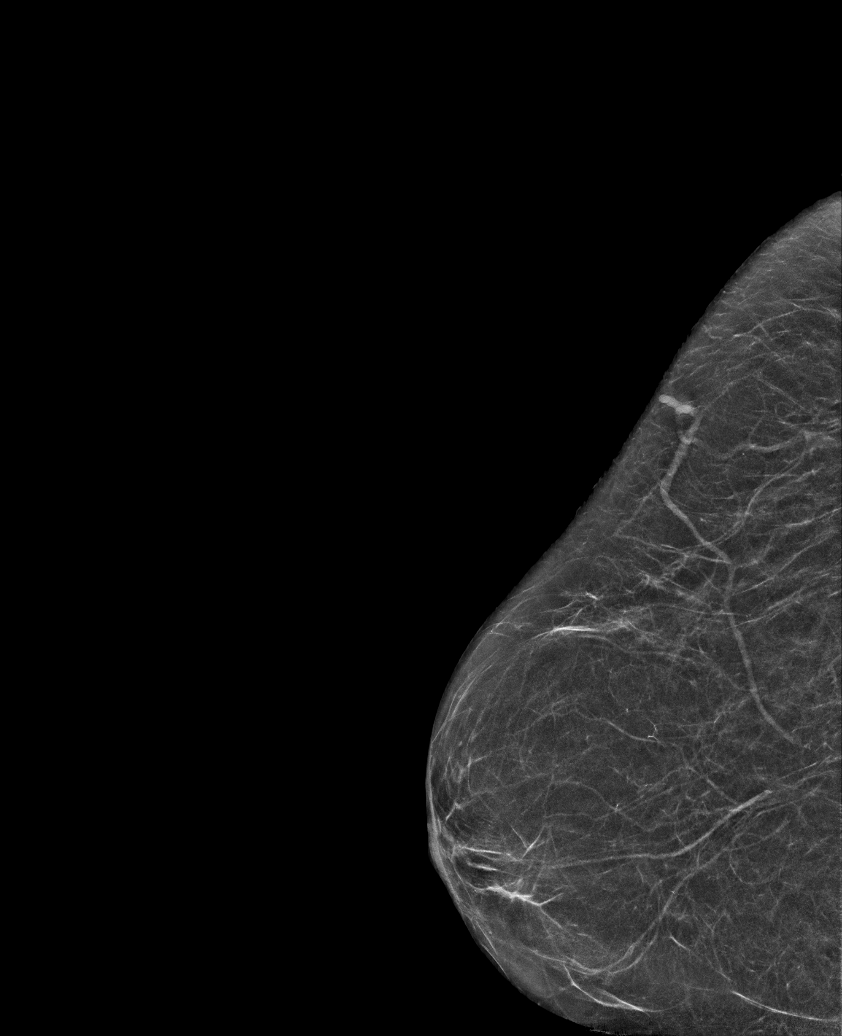

[R CC synth-2D]
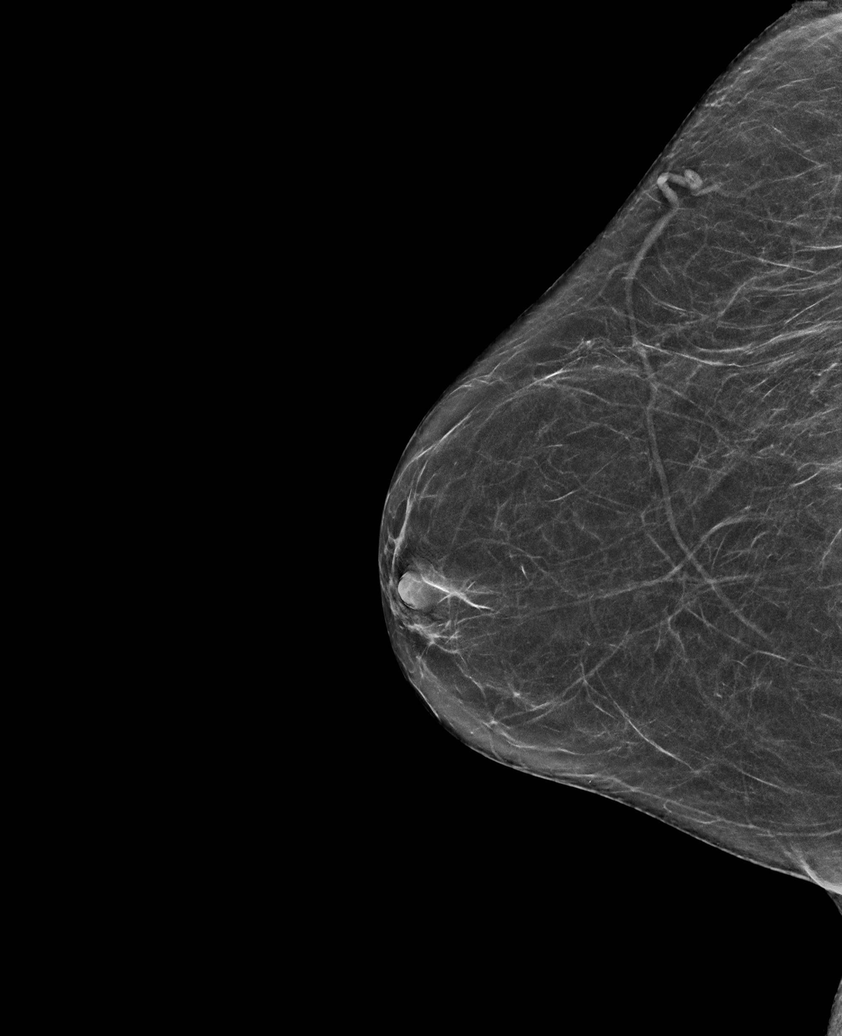

[R MLO synth-2D]
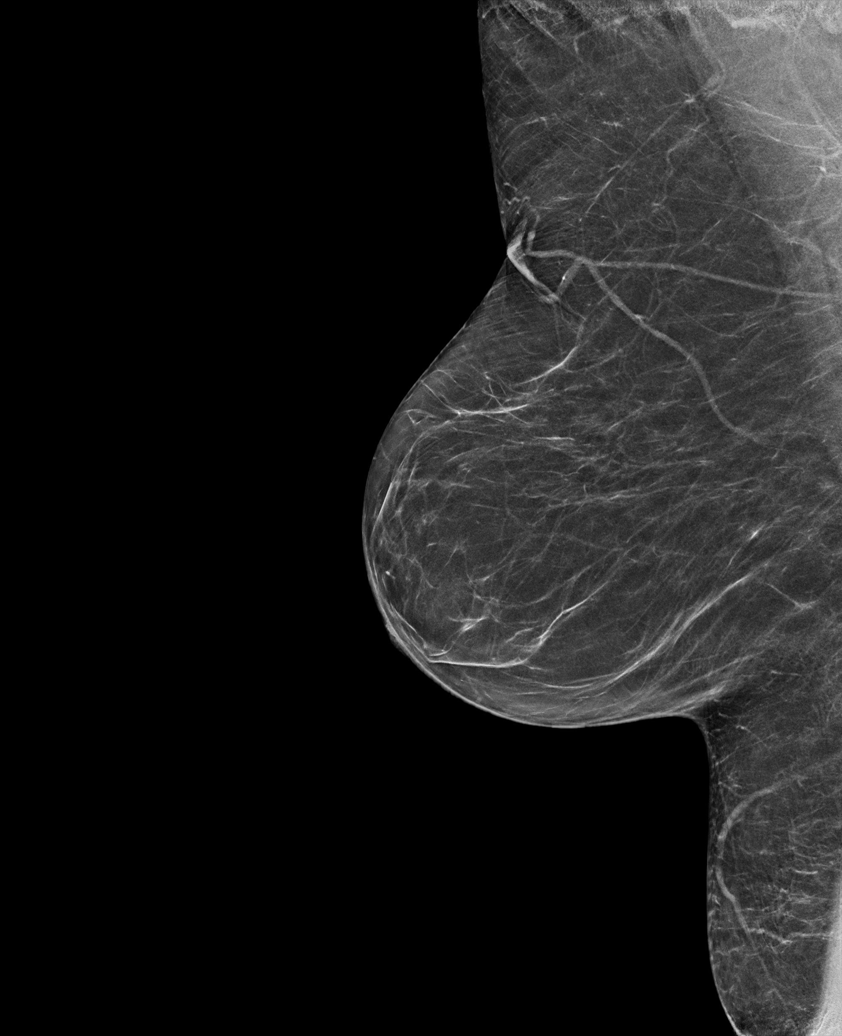

[L CC synth-2D]
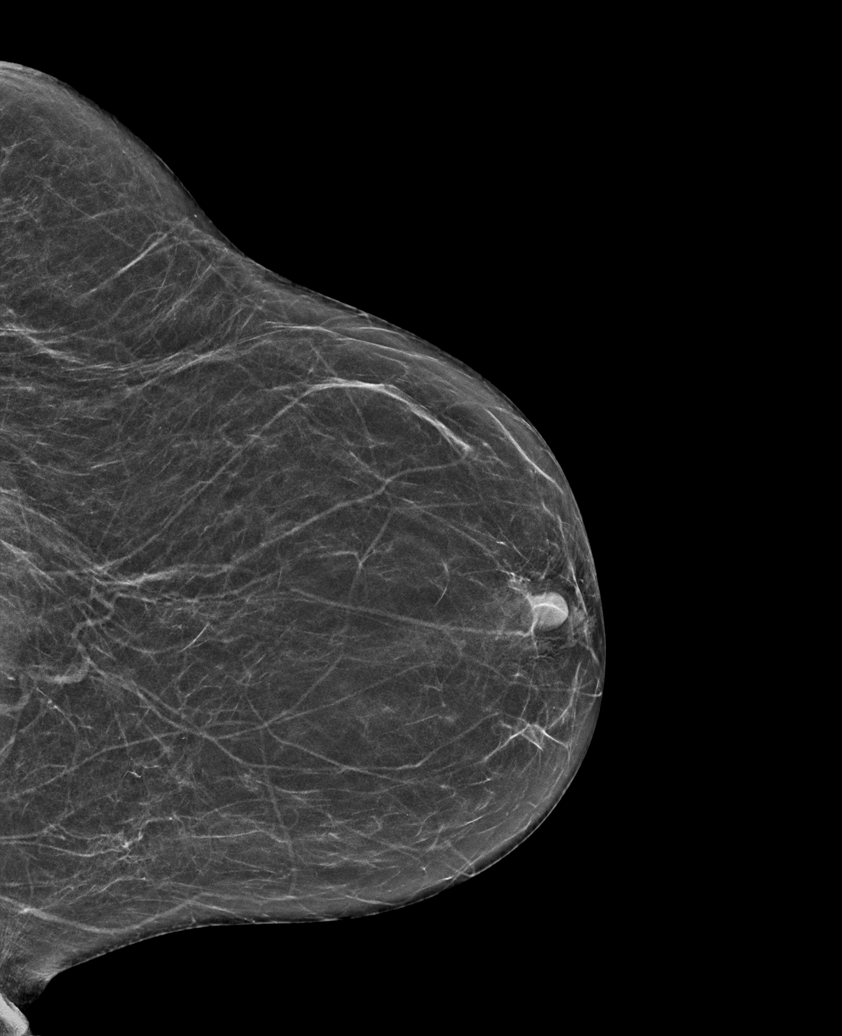

[L MLO tomo · tomo slice 27/54.0]
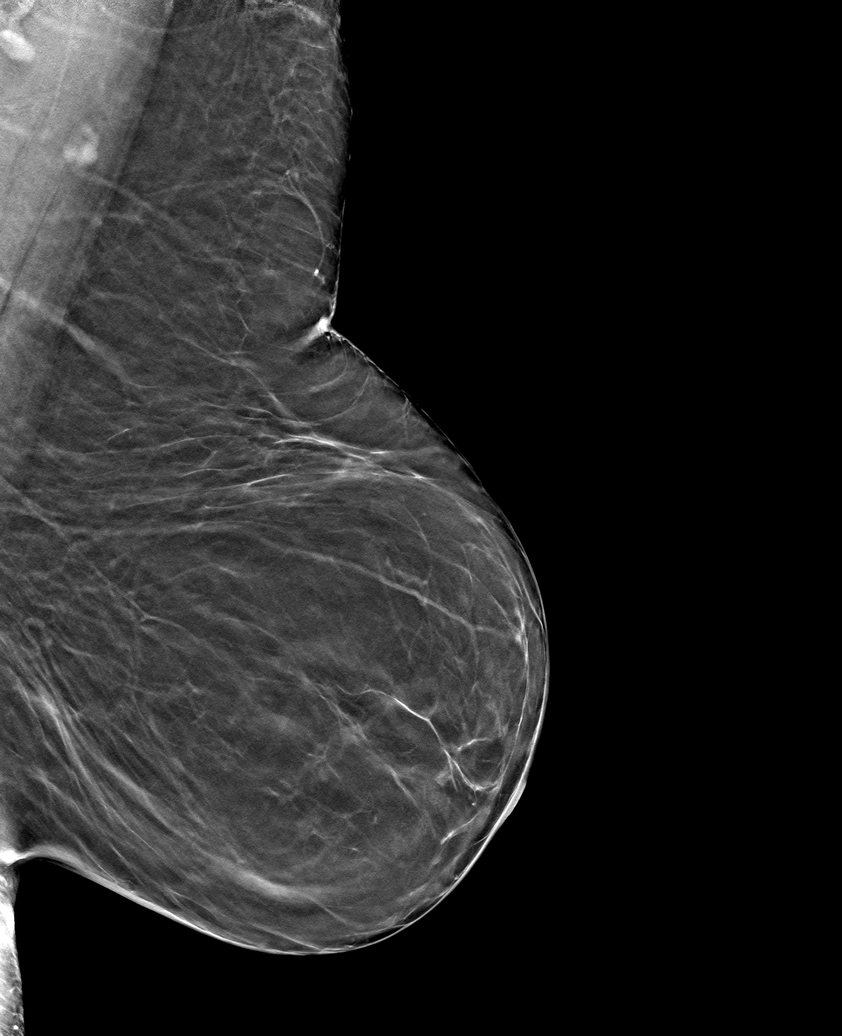

[6 of 30 positions shown; findings below may reference images not displayed]

FINDINGS: There are no findings suspicious for malignancy. The images were
evaluated with computer-aided detection.
IMPRESSION: No mammographic evidence of malignancy. A result letter of this
screening mammogram will be mailed directly to the patient.

RECOMMENDATION:
Screening mammogram in one year. (Code:JP-J-DD5)

BI-RADS CATEGORY  1: Negative.
